# Patient Record
Sex: Female | Born: 1975
Health system: Southern US, Community
[De-identification: ages and names within clinical notes are randomized; demographics above are authoritative.]

## PROBLEM LIST (undated history)

## (undated) DIAGNOSIS — G8384 Todd's paralysis (postepileptic): Secondary | ICD-10-CM

## (undated) DIAGNOSIS — J45909 Unspecified asthma, uncomplicated: Secondary | ICD-10-CM

## (undated) DIAGNOSIS — G43109 Migraine with aura, not intractable, without status migrainosus: Secondary | ICD-10-CM

## (undated) DIAGNOSIS — K2270 Barrett's esophagus without dysplasia: Secondary | ICD-10-CM

## (undated) DIAGNOSIS — E119 Type 2 diabetes mellitus without complications: Secondary | ICD-10-CM

## (undated) DIAGNOSIS — R569 Unspecified convulsions: Secondary | ICD-10-CM

## (undated) DIAGNOSIS — J40 Bronchitis, not specified as acute or chronic: Secondary | ICD-10-CM

## (undated) DIAGNOSIS — I10 Essential (primary) hypertension: Secondary | ICD-10-CM

## (undated) DIAGNOSIS — G43909 Migraine, unspecified, not intractable, without status migrainosus: Secondary | ICD-10-CM

## (undated) DIAGNOSIS — K5090 Crohn's disease, unspecified, without complications: Secondary | ICD-10-CM

## (undated) DIAGNOSIS — G4730 Sleep apnea, unspecified: Secondary | ICD-10-CM

## (undated) DIAGNOSIS — K760 Fatty (change of) liver, not elsewhere classified: Secondary | ICD-10-CM

## (undated) HISTORY — PX: PARTIAL HYSTERECTOMY: SHX80

---

## 2005-12-21 ENCOUNTER — Emergency Department (HOSPITAL_COMMUNITY): Admission: EM | Admit: 2005-12-21 | Discharge: 2005-12-21 | Payer: Self-pay | Admitting: Emergency Medicine

## 2014-08-01 ENCOUNTER — Emergency Department (HOSPITAL_COMMUNITY): Payer: Medicaid Other

## 2014-08-01 ENCOUNTER — Observation Stay (HOSPITAL_COMMUNITY)
Admission: EM | Admit: 2014-08-01 | Discharge: 2014-08-02 | Disposition: A | Discharge status: Home / Self Care | Payer: Medicaid Other | Attending: Internal Medicine | Admitting: Internal Medicine

## 2014-08-01 ENCOUNTER — Encounter (HOSPITAL_COMMUNITY): Payer: Self-pay | Admitting: *Deleted

## 2014-08-01 DIAGNOSIS — Z87891 Personal history of nicotine dependence: Secondary | ICD-10-CM | POA: Diagnosis not present

## 2014-08-01 DIAGNOSIS — Z8719 Personal history of other diseases of the digestive system: Secondary | ICD-10-CM | POA: Insufficient documentation

## 2014-08-01 DIAGNOSIS — Z91040 Latex allergy status: Secondary | ICD-10-CM | POA: Diagnosis not present

## 2014-08-01 DIAGNOSIS — F419 Anxiety disorder, unspecified: Secondary | ICD-10-CM | POA: Diagnosis not present

## 2014-08-01 DIAGNOSIS — Z79899 Other long term (current) drug therapy: Secondary | ICD-10-CM | POA: Diagnosis not present

## 2014-08-01 DIAGNOSIS — J45909 Unspecified asthma, uncomplicated: Secondary | ICD-10-CM | POA: Diagnosis not present

## 2014-08-01 DIAGNOSIS — R4701 Aphasia: Secondary | ICD-10-CM | POA: Diagnosis present

## 2014-08-01 DIAGNOSIS — G459 Transient cerebral ischemic attack, unspecified: Secondary | ICD-10-CM | POA: Diagnosis present

## 2014-08-01 DIAGNOSIS — K5090 Crohn's disease, unspecified, without complications: Secondary | ICD-10-CM | POA: Diagnosis present

## 2014-08-01 DIAGNOSIS — R079 Chest pain, unspecified: Secondary | ICD-10-CM | POA: Diagnosis present

## 2014-08-01 DIAGNOSIS — G40909 Epilepsy, unspecified, not intractable, without status epilepticus: Secondary | ICD-10-CM | POA: Insufficient documentation

## 2014-08-01 DIAGNOSIS — Z7951 Long term (current) use of inhaled steroids: Secondary | ICD-10-CM | POA: Diagnosis not present

## 2014-08-01 DIAGNOSIS — Z7952 Long term (current) use of systemic steroids: Secondary | ICD-10-CM | POA: Diagnosis not present

## 2014-08-01 DIAGNOSIS — F801 Expressive language disorder: Secondary | ICD-10-CM | POA: Insufficient documentation

## 2014-08-01 DIAGNOSIS — I639 Cerebral infarction, unspecified: Secondary | ICD-10-CM

## 2014-08-01 DIAGNOSIS — R251 Tremor, unspecified: Secondary | ICD-10-CM | POA: Diagnosis not present

## 2014-08-01 DIAGNOSIS — R479 Unspecified speech disturbances: Secondary | ICD-10-CM

## 2014-08-01 DIAGNOSIS — Z87898 Personal history of other specified conditions: Secondary | ICD-10-CM

## 2014-08-01 HISTORY — DX: Unspecified asthma, uncomplicated: J45.909

## 2014-08-01 HISTORY — DX: Bronchitis, not specified as acute or chronic: J40

## 2014-08-01 HISTORY — DX: Crohn's disease, unspecified, without complications: K50.90

## 2014-08-01 HISTORY — DX: Unspecified convulsions: R56.9

## 2014-08-01 LAB — I-STAT CHEM 8, ED
BUN: 15 mg/dL (ref 6–20)
Calcium, Ion: 1.2 mmol/L (ref 1.12–1.23)
Chloride: 101 mmol/L (ref 101–111)
Creatinine, Ser: 1.1 mg/dL — ABNORMAL HIGH (ref 0.44–1.00)
Glucose, Bld: 90 mg/dL (ref 70–99)
HCT: 43 % (ref 36.0–46.0)
Hemoglobin: 14.6 g/dL (ref 12.0–15.0)
Potassium: 4.4 mmol/L (ref 3.5–5.1)
Sodium: 143 mmol/L (ref 135–145)
TCO2: 27 mmol/L (ref 0–100)

## 2014-08-01 LAB — CBC
HCT: 39.8 % (ref 36.0–46.0)
Hemoglobin: 13.2 g/dL (ref 12.0–15.0)
MCH: 29.9 pg (ref 26.0–34.0)
MCHC: 33.2 g/dL (ref 30.0–36.0)
Platelets: 251 10*3/uL (ref 150–400)
RBC: 4.42 MIL/uL (ref 3.87–5.11)
RDW: 13.5 % (ref 11.5–15.5)

## 2014-08-01 LAB — DIFFERENTIAL
Basophils Relative: 0 % (ref 0–1)
Eosinophils Absolute: 0 10*3/uL (ref 0.0–0.7)
Eosinophils Relative: 0 % (ref 0–5)
Lymphocytes Relative: 16 % (ref 12–46)
Lymphs Abs: 3.2 10*3/uL (ref 0.7–4.0)
Monocytes Absolute: 1.4 10*3/uL — ABNORMAL HIGH (ref 0.1–1.0)
Monocytes Relative: 7 % (ref 3–12)
Neutro Abs: 15 10*3/uL — ABNORMAL HIGH (ref 1.7–7.7)
Neutrophils Relative %: 77 % (ref 43–77)

## 2014-08-01 LAB — COMPREHENSIVE METABOLIC PANEL
AST: 21 U/L (ref 15–41)
Albumin: 4.1 g/dL (ref 3.5–5.0)
Alkaline Phosphatase: 66 U/L (ref 38–126)
Anion gap: 6 (ref 5–15)
BUN: 13 mg/dL (ref 6–20)
CO2: 28 mmol/L (ref 22–32)
Calcium: 9.5 mg/dL (ref 8.9–10.3)
Chloride: 104 mmol/L (ref 101–111)
Creatinine, Ser: 1.03 mg/dL — ABNORMAL HIGH (ref 0.44–1.00)
GFR calc Af Amer: >60 mL/min (ref >60)
Glucose, Bld: 92 mg/dL (ref 70–99)
Total Bilirubin: 0.2 mg/dL — ABNORMAL LOW (ref 0.3–1.2)
Total Protein: 6.9 g/dL (ref 6.5–8.1)

## 2014-08-01 LAB — I-STAT TROPONIN, ED: Troponin i, poc: 0 ng/mL (ref 0.00–0.08)

## 2014-08-01 LAB — ETHANOL: Alcohol, Ethyl (B): <5 mg/dL (ref <5)

## 2014-08-01 LAB — PROTIME-INR
INR: 1.01 (ref 0.00–1.49)
Prothrombin Time: 13.4 seconds (ref 11.6–15.2)

## 2014-08-01 LAB — APTT: aPTT: 28 seconds (ref 24–37)

## 2014-08-01 NOTE — Code Documentation (Signed)
Cheryl Little presented to [facility] via pvt vehicle for slurred speech & Lt side numbness.  Per her sister she was taken to [facility] last pm for CP and asthma exacerbation.  She was d/c with po prednisone.  Later that evening she was noted to have slurred speech but was attributed to exhaustion.  Her sister noticed this am that she had more slurring of her speech but it seemed to improve some.  She took her to dinner around 8pm.  Her sister stated the pt went to the bathroom while at the restaurant and was gone a long time.  Upon returning to the table she was found to have worsening speech & Lt side weakness & was brought to the [facility].  She has stuttering speech but able to carry a conversation.  NIH 2

## 2014-08-01 NOTE — ED Notes (Signed)
[facility] contacted to call Code Stroke 

## 2014-08-01 NOTE — ED Notes (Signed)
Patient arrived via POV  Family reports that they were out to eat and she went to the [facility] and it took longer than usual and she sent someone in to check on her.  Found her with "odd speech"  Left sided weakness.

## 2014-08-01 NOTE — Consult Note (Signed)
Referring Physician: Dr. Jodi Mourning    Chief Complaint: code stroke, left sided numbness, slurred speech  HPI:                                                                                                                                         Cheryl Little is an 39 y.o. female with a past medical history significant for Crohn's disease, asthma, and seizures, brought in by her sister via private vehicle for further evaluation of the above stated symptoms. Her sister is at the bedside and indicated that patient started having some speech impediment last night, more noticeable this morning but it seemed to improve some.She took her to dinner around 8pm.and patient went to the bathroom while at the restaurant and was gone a long time. Upon returning to the table she was noted to be unable to form words and had left side  weakness-numbness. Patient was seen at [facility] last night due to chest pain and asthma exacerbation, was treated with IV solumedrol and benadryl and discharged home with instructions to take prednisone. NIHSS 2. CT brain was personally reviewed and showed no acute abnormality. Presently, she has stuttering speech.  Date last known well: 07/31/14 Time last known well: unclear tPA Given: no, late presentation NIHSS: 2 MRS: 0  Past Medical History  Diagnosis Date  . Seizures   . Asthma   . Bronchitis   . Crohn's disease     Past Surgical History  Procedure Laterality Date  . Partial hysterectomy      No family history on file. Social History:  reports that she has quit smoking. She has never used smokeless tobacco. She reports that she does not drink alcohol or use illicit drugs. Family history: no brain tumor, epilepsy, or brain aneurysms Allergies:  Allergies  Allergen Reactions  . Aspirin Anaphylaxis  . Latex Other (See Comments)    Inward and outward burning    Medications:                                                                                                                            I have reviewed the patient's current medications.  ROS:  History obtained from the patient, sister, and chart review  General ROS: negative for - chills, fatigue, fever, night sweats,  or weight loss Psychological ROS: negative for - behavioral disorder, hallucinations, memory difficulties, mood swings or suicidal ideation Ophthalmic ROS: negative for - blurry vision, double vision, eye pain or loss of vision ENT ROS: negative for - epistaxis, nasal discharge, oral lesions, sore throat, tinnitus or vertigo Allergy and Immunology ROS: negative for - hives or itchy/watery eyes Hematological and Lymphatic ROS: negative for - bleeding problems, bruising or swollen lymph nodes Endocrine ROS: negative for - galactorrhea, hair pattern changes, polydipsia/polyuria or temperature intolerance Respiratory ROS: negative for - cough, hemoptysis, shortness of breath or wheezing Cardiovascular ROS: significant for - chest pain Gastrointestinal ROS: negative for - abdominal pain, diarrhea, hematemesis, nausea/vomiting or stool incontinence Genito-Urinary ROS: negative for - dysuria, hematuria, incontinence or urinary frequency/urgency Musculoskeletal ROS: negative for - joint swelling or muscular weakness Neurological ROS: as noted in HPI Dermatological ROS: negative for rash and skin lesion changes    Physical exam: pleasant female in no apparent distress. Blood pressure 140/85, pulse 63, temperature 97.8 F (36.6 C), temperature source Oral, resp. rate 21, height 5\' 5"  (1.651 m), weight 94.802 kg (209 lb), SpO2 99 %. Neck: supple, no bruits, no JVD. Cardiac: no murmurs. Lungs: clear. Abdomen: soft, no tender, no mass. Extremities: no edema. Skin: no rash  Neurologic Examination:                                                                                                       General: Mental Status: Alert, oriented, thought content appropriate.  Stuttering peech but no frank dysarthria or evidence of aphasia.  Able to follow 3 step commands without difficulty. Cranial Nerves: II: Discs flat bilaterally; Visual fields grossly normal, pupils equal, round, reactive to light and accommodation III,IV, VI: ptosis not present, extra-ocular motions intact bilaterally V,VII: smile symmetric, facial light touch sensation normal bilaterally VIII: hearing normal bilaterally IX,X: uvula rises symmetrically XI: bilateral shoulder shrug XII: midline tongue extension without atrophy or fasciculations  Motor: Right : Upper extremity   5/5    Left:     Upper extremity   5/5  Lower extremity   5/5     Lower extremity   5/5 Tone and bulk:normal tone throughout; no atrophy noted Sensory: Pinprick and light touch diminished in the left side. Deep Tendon Reflexes:  Right: Upper Extremity   Left: Upper extremity   biceps (C-5 to C-6) 2/4   biceps (C-5 to C-6) 2/4 tricep (C7) 2/4    triceps (C7) 2/4 Brachioradialis (C6) 2/4  Brachioradialis (C6) 2/4  Lower Extremity Lower Extremity  quadriceps (L-2 to L-4) 2/4   quadriceps (L-2 to L-4) 2/4 Achilles (S1) 2/4   Achilles (S1) 2/4  Plantars: Right: downgoing   Left: downgoing Cerebellar: normal finger-to-nose,  normal heel-to-shin test Gait:  Unable to test due to multiple leads    Results for orders placed or performed during the hospital encounter of 08/01/14 (from the past 48 hour(s))  Protime-INR  Status: None   Collection Time: 08/01/14 10:10 PM  Result Value Ref Range   Prothrombin Time 13.4 11.6 - 15.2 seconds   INR 1.01 0.00 - 1.49  APTT     Status: None   Collection Time: 08/01/14 10:10 PM  Result Value Ref Range   aPTT 28 24 - 37 seconds  CBC     Status: Abnormal   Collection Time: 08/01/14 10:10 PM  Result Value Ref Range   WBC 19.5 (H) 4.0 - 10.5 K/uL   RBC  4.42 3.87 - 5.11 MIL/uL   Hemoglobin 13.2 12.0 - 15.0 g/dL   HCT 40.9 81.1 - 91.4 %   MCV 90.0 78.0 - 100.0 fL   MCH 29.9 26.0 - 34.0 pg   MCHC 33.2 30.0 - 36.0 g/dL   RDW 78.2 95.6 - 21.3 %   Platelets 251 150 - 400 K/uL  Differential     Status: Abnormal   Collection Time: 08/01/14 10:10 PM  Result Value Ref Range   Neutrophils Relative % 77 43 - 77 %   Neutro Abs 15.0 (H) 1.7 - 7.7 K/uL   Lymphocytes Relative 16 12 - 46 %   Lymphs Abs 3.2 0.7 - 4.0 K/uL   Monocytes Relative 7 3 - 12 %   Monocytes Absolute 1.4 (H) 0.1 - 1.0 K/uL   Eosinophils Relative 0 0 - 5 %   Eosinophils Absolute 0.0 0.0 - 0.7 K/uL   Basophils Relative 0 0 - 1 %   Basophils Absolute 0.0 0.0 - 0.1 K/uL  I-Stat Chem 8, ED  (not at [facility], [facility])     Status: Abnormal   Collection Time: 08/01/14 10:21 PM  Result Value Ref Range   Sodium 143 135 - 145 mmol/L   Potassium 4.4 3.5 - 5.1 mmol/L   Chloride 101 101 - 111 mmol/L   BUN 15 6 - 20 mg/dL   Creatinine, Ser 0.86 (H) 0.44 - 1.00 mg/dL   Glucose, Bld 90 70 - 99 mg/dL   Calcium, Ion 5.78 1.12 - 1.23 mmol/L   TCO2 27 0 - 100 mmol/L   Hemoglobin 14.6 12.0 - 15.0 g/dL   HCT 46.9 62.9 - 52.8 %  I-stat troponin, ED (not at [facility], [facility])     Status: None   Collection Time: 08/01/14 10:21 PM  Result Value Ref Range   Troponin i, poc 0.00 0.00 - 0.08 ng/mL   Comment 3            Comment: Due to the release kinetics of cTnI, a negative result within the first hours of the onset of symptoms does not rule out myocardial infarction with certainty. If myocardial infarction is still suspected, repeat the test at appropriate intervals.    Ct Head Wo Contrast  08/01/2014   CLINICAL DATA:  Code stroke. Slurred speech and left-sided weakness.  EXAM: CT HEAD WITHOUT CONTRAST  TECHNIQUE: Contiguous axial images were obtained from the base of the skull through the vertex without intravenous contrast.  COMPARISON:  None.  FINDINGS: The ventricles and sulci are within normal  limits for age. There is no evidence of acute infarct, intracranial hemorrhage, mass, midline shift, or extra-axial collection.  The orbits are unremarkable. The visualized paranasal sinuses and mastoid air cells are clear. No skull fracture is identified.  IMPRESSION: Unremarkable head CT.  These results were called by telephone at the time of interpretation on 08/01/2014 at 10:28 pm to Dr. Leroy Kennedy, who verbally acknowledged these results.   Electronically Signed  By: Sebastian Ache   On: 08/01/2014 22:28    Assessment: 39 y.o. female with acute onset stuttering speech and left sided numbness. NIHSS 2. CT brain negative for acute abnormality. Functional stuttering, presentation less likely due to medication side effects. MRI brain. If negative, no further neuro testing required. However, will recommend observing patient overnight she this is her second visit to an ED in 24 hours.   Wyatt Portela, MD [facility] [phone]  08/01/2014, 10:48 PM

## 2014-08-02 DIAGNOSIS — K5090 Crohn's disease, unspecified, without complications: Secondary | ICD-10-CM

## 2014-08-02 DIAGNOSIS — Z87898 Personal history of other specified conditions: Secondary | ICD-10-CM

## 2014-08-02 DIAGNOSIS — Z8669 Personal history of other diseases of the nervous system and sense organs: Secondary | ICD-10-CM

## 2014-08-02 DIAGNOSIS — R4701 Aphasia: Secondary | ICD-10-CM | POA: Diagnosis not present

## 2014-08-02 DIAGNOSIS — Z789 Other specified health status: Secondary | ICD-10-CM

## 2014-08-02 DIAGNOSIS — G459 Transient cerebral ischemic attack, unspecified: Secondary | ICD-10-CM

## 2014-08-02 DIAGNOSIS — R079 Chest pain, unspecified: Secondary | ICD-10-CM | POA: Diagnosis not present

## 2014-08-02 DIAGNOSIS — J45909 Unspecified asthma, uncomplicated: Secondary | ICD-10-CM | POA: Diagnosis not present

## 2014-08-02 LAB — TROPONIN I
Troponin I: <0.03 ng/mL (ref <0.031)
Troponin I: <0.03 ng/mL (ref <0.031)

## 2014-08-02 LAB — RAPID URINE DRUG SCREEN, HOSP PERFORMED
Amphetamines: NOT DETECTED
Barbiturates: NOT DETECTED
Benzodiazepines: NOT DETECTED
Cocaine: NOT DETECTED
Opiates: NOT DETECTED
Tetrahydrocannabinol: NOT DETECTED

## 2014-08-02 LAB — URINALYSIS, ROUTINE W REFLEX MICROSCOPIC
Hgb urine dipstick: NEGATIVE
Leukocytes, UA: NEGATIVE
Nitrite: NEGATIVE
Protein, ur: NEGATIVE mg/dL
Specific Gravity, Urine: 1.024 (ref 1.005–1.030)
Urobilinogen, UA: 0.2 mg/dL (ref 0.0–1.0)
pH: 6.5 (ref 5.0–8.0)

## 2014-08-02 LAB — LIPID PANEL
Cholesterol: 189 mg/dL (ref 0–200)
HDL: 45 mg/dL (ref >40)
LDL Cholesterol: 116 mg/dL — ABNORMAL HIGH (ref 0–99)
Total CHOL/HDL Ratio: 4.2 RATIO
Triglycerides: 140 mg/dL (ref <150)
VLDL: 28 mg/dL (ref 0–40)

## 2014-08-02 MED ORDER — STROKE: EARLY STAGES OF RECOVERY BOOK
Freq: Once | Status: AC
  Administered 2014-08-02: 1
  Filled 2014-08-02: qty 1

## 2014-08-02 MED ORDER — ENOXAPARIN SODIUM 40 MG/0.4ML ~~LOC~~ SOLN
40.0000 mg | SUBCUTANEOUS | Status: DC
  Administered 2014-08-02: 40 mg via SUBCUTANEOUS
  Filled 2014-08-02: qty 0.4

## 2014-08-02 MED ORDER — IPRATROPIUM-ALBUTEROL 0.5-2.5 (3) MG/3ML IN SOLN
3.0000 mL | Freq: Four times a day (QID) | RESPIRATORY_TRACT | Status: DC | PRN
  Administered 2014-08-02: 3 mL via RESPIRATORY_TRACT
  Filled 2014-08-02: qty 3

## 2014-08-02 NOTE — ED Notes (Signed)
Dr. Jenkins at bedside. 

## 2014-08-02 NOTE — ED Notes (Signed)
3W APPT 0130

## 2014-08-02 NOTE — Discharge Summary (Signed)
PATIENT DETAILS Name: Cheryl Little Age: 39 y.o. Sex: female Date of Birth: Mar 18, 1976 MRN: [id number]. Admitting Physician: Cheryl Parker, MD PCP:No primary care provider on file.  Admit Date: 08/01/2014 Discharge date: 08/02/2014  Recommendations for Outpatient Follow-up:  1. Gen Health Maintenance  PRIMARY DISCHARGE DIAGNOSIS:  Active Problems:  Functional Stuterring   Chest pain   Asthma   Crohn's disease   History of seizure      PAST MEDICAL HISTORY: Past Medical History  Diagnosis Date  . Seizures   . Asthma   . Bronchitis   . Crohn's disease     DISCHARGE MEDICATIONS: Current Discharge Medication List    STOP taking these medications     acetaminophen (TYLENOL) 500 MG tablet      albuterol (PROVENTIL HFA;VENTOLIN HFA) 108 (90 BASE) MCG/ACT inhaler      ipratropium (ATROVENT) 0.02 % nebulizer solution      mometasone-formoterol (DULERA) 200-5 MCG/ACT AERO      predniSONE (DELTASONE) 20 MG tablet         ALLERGIES:   Allergies  Allergen Reactions  . Aspirin Anaphylaxis  . Latex Other (See Comments)    Inward and outward burning    BRIEF HPI:  See H&P, Labs, Consult and Test reports for all details in brief, patient was admitted for speech difficulty and right arm weakness.  CONSULTATIONS:   neurology  PERTINENT RADIOLOGIC STUDIES: Ct Head Wo Contrast  08/01/2014   CLINICAL DATA:  Code stroke. Slurred speech and left-sided weakness.  EXAM: CT HEAD WITHOUT CONTRAST  TECHNIQUE: Contiguous axial images were obtained from the base of the skull through the vertex without intravenous contrast.  COMPARISON:  None.  FINDINGS: The ventricles and sulci are within normal limits for age. There is no evidence of acute infarct, intracranial hemorrhage, mass, midline shift, or extra-axial collection.  The orbits are unremarkable. The visualized paranasal sinuses and mastoid air cells are clear. No skull fracture is identified.  IMPRESSION: Unremarkable  head CT.  These results were called by telephone at the time of interpretation on 08/01/2014 at 10:28 pm to Cheryl. Leroy Little, who verbally acknowledged these results.   Electronically Signed   By: Cheryl Little   On: 08/01/2014 22:28   Mr Brain Wo Contrast  08/01/2014   CLINICAL DATA:  39 year old female with acute onset stuttering speech and left-sided numbness.  EXAM: MRI HEAD WITHOUT CONTRAST  TECHNIQUE: Multiplanar, multiecho pulse sequences of the brain and surrounding structures were obtained without intravenous contrast.  COMPARISON:  Prior CT from earlier the same day.  FINDINGS: The CSF containing spaces are within normal limits for patient age. No focal parenchymal signal abnormality is identified. No mass lesion, midline shift, or extra-axial fluid collection. Ventricles are normal in size without evidence of hydrocephalus.  No diffusion-weighted signal abnormality is identified to suggest acute intracranial infarct. Gray-white matter differentiation is maintained. Normal flow voids are seen within the intracranial vasculature. No intracranial hemorrhage identified.  The cervicomedullary junction is normal. Pituitary gland is within normal limits. Pituitary stalk is midline. The globes and optic nerves demonstrate a normal appearance with normal signal intensity.  The bone marrow signal intensity is normal. Calvarium is intact. Visualized upper cervical spine is within normal limits.  Scalp soft tissues are unremarkable.  Paranasal sinuses are clear.  No mastoid effusion.  IMPRESSION: Normal MRI of the brain.   Electronically Signed   By: Rise Mu M.D.   On: 08/01/2014 23:59     PERTINENT LAB RESULTS: CBC:  Recent Labs  08/01/14 2210 08/01/14 2221  WBC 19.5*  --   HGB 13.2 14.6  HCT 39.8 43.0  PLT 251  --    CMET CMP     Component Value Date/Time   NA 143 08/01/2014 2221   K 4.4 08/01/2014 2221   CL 101 08/01/2014 2221   CO2 28 08/01/2014 2210   GLUCOSE 90 08/01/2014 2221    BUN 15 08/01/2014 2221   CREATININE 1.10* 08/01/2014 2221   CALCIUM 9.5 08/01/2014 2210   PROT 6.9 08/01/2014 2210   ALBUMIN 4.1 08/01/2014 2210   AST 21 08/01/2014 2210   ALT 19 08/01/2014 2210   ALKPHOS 66 08/01/2014 2210   BILITOT 0.2* 08/01/2014 2210   GFRNONAA >60 08/01/2014 2210   GFRAA >60 08/01/2014 2210    GFR Estimated Creatinine Clearance: 80.4 mL/min (by C-G formula based on Cr of 1.1). No results for input(s): LIPASE, AMYLASE in the last 72 hours.  Recent Labs  08/02/14 0336 08/02/14 0850  TROPONINI <0.03 <0.03   Invalid input(s): POCBNP No results for input(s): DDIMER in the last 72 hours. No results for input(s): HGBA1C in the last 72 hours.  Recent Labs  08/02/14 0645  CHOL 189  HDL 45  LDLCALC 116*  TRIG 140  CHOLHDL 4.2   No results for input(s): TSH, T4TOTAL, T3FREE, THYROIDAB in the last 72 hours.  Invalid input(s): FREET3 No results for input(s): VITAMINB12, FOLATE, FERRITIN, TIBC, IRON, RETICCTPCT in the last 72 hours. Coags:  Recent Labs  08/01/14 2210  INR 1.01   Microbiology: No results found for this or any previous visit (from the past 240 hour(s)).   BRIEF HOSPITAL COURSE:  Stuttering of speech/left sided weakness: inconsistent exam--seen by this MD on day of discharge-saw her lift her left arm, also observed to pull her self up to sit in bed using her left upper ext-MRI Brain negative for CVA. Reviewed Neuro consult-recommended no further work up if MRI brain negative. Subsequently spoke with Neuro (Cheryl Little has already signed off-no further recommendations. Current suspicion for a functional disorder. Have provided reassurance to patient and mother. Stable for discharge home.  Aphasia  Asthma:lungs clear-no wheezing-doubt asthma flare   History of seizure:claims seizure free for >1 year-and that her PCP weaned her off Depakote. Her current neuro issues are more functional that Cheryl Little's paresis   TODAY-DAY OF  DISCHARGE:  Subjective:   Cheryl Little today has no headache,no chest abdominal pain,no new weakness tingling or numbness, feels much better wants to go home today.   Objective:   Blood pressure 129/85, pulse 63, temperature 98.4 F (36.9 C), temperature source Oral, resp. rate 18, height  (1.651 m), weight 96.206 kg (212 lb 1.5 oz), SpO2 100 %.  Intake/Output Summary (Last 24 hours) at 08/02/14 1054 Last data filed at 08/02/14 0824  Gross per 24 hour  Intake      0 ml  Output    350 ml  Net   -350 ml   Filed Weights   08/01/14 2224 08/02/14 0200 08/02/14 0600  Weight: 94.802 kg (209 lb) 96.026 kg (211 lb 11.2 oz) 96.206 kg (212 lb 1.5 oz)    Exam Awake Alert, Oriented *3, No new F.N deficits, Normal affect [place].AT,PERRAL Supple Neck,No JVD, No cervical lymphadenopathy appriciated.  Symmetrical Chest wall movement, Good air movement bilaterally, CTAB RRR,No Gallops,Rubs or new Murmurs, No Parasternal Heave +ve B.Sounds, Abd Soft, Non tender, No organomegaly appriciated, No rebound -guarding or rigidity. No Cyanosis, Clubbing or  edema, No new Rash or bruise  DISCHARGE CONDITION: Stable  DISPOSITION: Home  DISCHARGE INSTRUCTIONS:    Activity:  As tolerated with Full fall precautions use walker/cane & assistance as needed  Diet recommendation: Regular Diet  Discharge Instructions    Diet general    Complete by:  As directed      Increase activity slowly    Complete by:  As directed            Follow-up Information    Schedule an appointment as soon as possible for a visit in 1 week to follow up.   Why:  for hospital discharge   Contact information:   Aaron Edelman      Total Time spent on discharge equals 25 minutes.  SignedJeoffrey Massed 08/02/2014 10:54 AM

## 2014-08-02 NOTE — ED Provider Notes (Signed)
CSN: [id number]     Arrival date & time 08/01/14  2201 History   First MD Initiated Contact with Patient 08/01/14 2219     Chief Complaint  Patient presents with  . Code Stroke    @ (Consider location/radiation/quality/duration/timing/severity/associated sxs/prior Treatment) HPI Comments: 39 year old female past smoker, asthma and seizure history, not on seizure medications his been doing well for the past year presented with expressive aphasia since last night. Around 8:00 this evening patient had left-sided numbness. Patient was recently seen at [facility] and given steroids. No history of similar. No significant headache fevers or chills. No history of stroke.  Nothing has improved her speech.  The history is provided by a relative.    Past Medical History  Diagnosis Date  . Seizures   . Asthma   . Bronchitis   . Crohn's disease    Past Surgical History  Procedure Laterality Date  . Partial hysterectomy     No family history on file. History  Substance Use Topics  . Smoking status: Former Games developer  . Smokeless tobacco: Never Used  . Alcohol Use: No   OB History    No data available     Review of Systems  Constitutional: Negative for fever and chills.  HENT: Negative for congestion.   Eyes: Negative for visual disturbance.  Respiratory: Negative for shortness of breath.   Cardiovascular: Negative for chest pain.  Gastrointestinal: Negative for vomiting and abdominal pain.  Genitourinary: Negative for dysuria and flank pain.  Musculoskeletal: Negative for back pain, neck pain and neck stiffness.  Skin: Negative for rash.  Neurological: Positive for speech difficulty and numbness. Negative for light-headedness and headaches.  Psychiatric/Behavioral: The patient is nervous/anxious.       Allergies  Aspirin and Latex  Home Medications   Prior to Admission medications   Medication Sig Start Date End Date Taking? Authorizing Provider  acetaminophen (TYLENOL)  500 MG tablet Take 500 mg by mouth every 6 (six) hours as needed (pain).   Yes Historical Provider, MD  albuterol (PROVENTIL HFA;VENTOLIN HFA) 108 (90 BASE) MCG/ACT inhaler Inhale 1 puff into the lungs every 6 (six) hours as needed for wheezing or shortness of breath.   Yes Historical Provider, MD  ipratropium (ATROVENT) 0.02 % nebulizer solution Take 0.5 mg by nebulization every 6 (six) hours as needed for wheezing or shortness of breath.   Yes Historical Provider, MD  mometasone-formoterol (DULERA) 200-5 MCG/ACT AERO Inhale 2 puffs into the lungs 2 (two) times daily.   Yes Historical Provider, MD  predniSONE (DELTASONE) 20 MG tablet Take 20 mg by mouth 2 (two) times daily with a meal. 4 day course started 08/01/14   Yes Historical Provider, MD   BP 131/72 mmHg  Pulse 67  Temp(Src) 97.8 F (36.6 C) (Oral)  Resp 17  Ht  (1.651 m)  Wt 209 lb (94.802 kg)  BMI 34.78 kg/m2  SpO2 98% Physical Exam  Constitutional: She is oriented to person, place, and time. She appears well-developed and well-nourished.  HENT:  Head: Normocephalic and atraumatic.  Eyes: Conjunctivae are normal. Right eye exhibits no discharge. Left eye exhibits no discharge.  Neck: Normal range of motion. Neck supple. No tracheal deviation present.  Cardiovascular: Normal rate and regular rhythm.   Pulmonary/Chest: Effort normal and breath sounds normal.  Abdominal: Soft. She exhibits no distension. There is no tenderness. There is no guarding.  Musculoskeletal: She exhibits no edema.  Neurological: She is alert and oriented to person, place, and time.  5+ strength in UE and LE with f/e at major joints. Sensation to palpation intact in UE and LE. CNs 2-12 grossly intact.  EOMFI.  PERRL.   Finger nose and coordination intact bilateral.   Visual fields intact to finger testing. Patient has mild intermittent expressive aphasia at times normal speech.  Skin: Skin is warm. No rash noted.  Psychiatric: She has a normal mood  and affect.  Nursing note and vitals reviewed.   ED Course  Procedures (including critical care time) Labs Review Labs Reviewed  COMPREHENSIVE METABOLIC PANEL - Abnormal; Notable for the following:    Creatinine, Ser 1.03 (*)    Total Bilirubin 0.2 (*)    All other components within normal limits  CBC - Abnormal; Notable for the following:    WBC 19.5 (*)    All other components within normal limits  DIFFERENTIAL - Abnormal; Notable for the following:    Neutro Abs 15.0 (*)    Monocytes Absolute 1.4 (*)    All other components within normal limits  I-STAT CHEM 8, ED - Abnormal; Notable for the following:    Creatinine, Ser 1.10 (*)    All other components within normal limits  ETHANOL  PROTIME-INR  APTT  URINE RAPID DRUG SCREEN (HOSP PERFORMED)  URINALYSIS, ROUTINE W REFLEX MICROSCOPIC  I-STAT TROPOININ, ED    Imaging Review Ct Head Wo Contrast  08/01/2014   CLINICAL DATA:  Code stroke. Slurred speech and left-sided weakness.  EXAM: CT HEAD WITHOUT CONTRAST  TECHNIQUE: Contiguous axial images were obtained from the base of the skull through the vertex without intravenous contrast.  COMPARISON:  None.  FINDINGS: The ventricles and sulci are within normal limits for age. There is no evidence of acute infarct, intracranial hemorrhage, mass, midline shift, or extra-axial collection.  The orbits are unremarkable. The visualized paranasal sinuses and mastoid air cells are clear. No skull fracture is identified.  IMPRESSION: Unremarkable head CT.  These results were called by telephone at the time of interpretation on 08/01/2014 at 10:28 pm to Dr. Leroy Kennedy, who verbally acknowledged these results.   Electronically Signed   By: Sebastian Ache   On: 08/01/2014 22:28   Mr Brain Wo Contrast  08/01/2014   CLINICAL DATA:  39 year old female with acute onset stuttering speech and left-sided numbness.  EXAM: MRI HEAD WITHOUT CONTRAST  TECHNIQUE: Multiplanar, multiecho pulse sequences of the brain and  surrounding structures were obtained without intravenous contrast.  COMPARISON:  Prior CT from earlier the same day.  FINDINGS: The CSF containing spaces are within normal limits for patient age. No focal parenchymal signal abnormality is identified. No mass lesion, midline shift, or extra-axial fluid collection. Ventricles are normal in size without evidence of hydrocephalus.  No diffusion-weighted signal abnormality is identified to suggest acute intracranial infarct. Gray-white matter differentiation is maintained. Normal flow voids are seen within the intracranial vasculature. No intracranial hemorrhage identified.  The cervicomedullary junction is normal. Pituitary gland is within normal limits. Pituitary stalk is midline. The globes and optic nerves demonstrate a normal appearance with normal signal intensity.  The bone marrow signal intensity is normal. Calvarium is intact. Visualized upper cervical spine is within normal limits.  Scalp soft tissues are unremarkable.  Paranasal sinuses are clear.  No mastoid effusion.  IMPRESSION: Normal MRI of the brain.   Electronically Signed   By: Rise Mu M.D.   On: 08/01/2014 23:59     EKG Interpretation   Date/Time:  Saturday Aug 01 2014 22:26:48 EDT Ventricular  Rate:  64 PR Interval:  120 QRS Duration: 76 QT Interval:  399 QTC Calculation: 412 R Axis:   54 Text Interpretation:  Sinus rhythm Confirmed by Le Ferraz  MD, Kirkland Figg ([id number])  on 08/02/2014 12:33:04 AM      MDM   Final diagnoses:  Occasional tremors  Expressive aphasia   Patient presents with expressive aphasia, numbness in the left side of her body started around 8 PM, code stroke called by nursing. Neurology assisted with testing at the bedside, no acute deficits. Concern for possible functional, less likely seizure related and less likely stroke. MRI ordered of the brain no acute findings. Neurology recommends observation in the hospital.  Spoke with neurology, rec  observation.   The patients results and plan were reviewed and discussed.   Any x-rays performed were personally reviewed by myself.   Differential diagnosis were considered with the presenting HPI.  Medications - No data to display  Filed Vitals:   08/01/14 2342 08/01/14 2345 08/02/14 0000 08/02/14 0024  BP: 125/84 130/75 131/87 131/72  Pulse: 67 67 64 67  Temp:      TempSrc:      Resp: 19 15 14 17   Height:      Weight:      SpO2: 98% 99% 99% 98%    Final diagnoses:  Occasional tremors  Expressive aphasia    Admission/ observation were discussed with the admitting physician, patient and/or family and they are comfortable with the plan.     Blane Ohara, MD 08/02/14 [phone]

## 2014-08-02 NOTE — Progress Notes (Signed)
UR completed 

## 2014-08-02 NOTE — H&P (Addendum)
[facility] Admission History and Physical       Cheryl Little ZOX:[id number] DOB: 04-21-1975 DOA: 08/01/2014  Referring physician: EDP PCP: No primary care provider on file.  Specialists:   Chief Complaint: Left Sided Weakness,  Difficulty Speaking,  and Chest Pain  HPI: Cheryl Little is a 39 y.o. female with a history of Asthma, Crohn's Disease, and Past hx of Seizures ( No Seizures > 2 years and off Medications x 8 Months) who presents to the ED with complaints of Left sided weakness since 8 pm.    Prior to this she had intermittent episodes of difficulty with her speech, describing it as her speech was clear but would taper off into slurring and this began 1 day ago.   She reports having a posterior neck area  Headache, as well as intermittent chest pain -chest tightness for the past 2 days and was seen at the [facility] ED last night and administered IV Solumedrol and prescribed Prednisone for an Asthma Exacerbation.  She reports her headache and chest tightness only  improved when she was placed on the facial mask Oxygen administered in the ED.    She was evaluated as a Code Stroke in the ED, and was seen by Dr. Cyril Little Neurology.   Her initial Head Ct Scan was negative, and the MRI results were also negative for acute findings.      Review of Systems:    Constitutional: No Weight Loss, No Weight Gain, Night Sweats, Fevers, Chills, Dizziness, Light Headedness, Fatigue, or Generalized Weakness HEENT: No +Headaches, Difficulty Swallowing,Tooth/Dental Problems,Sore Throat,  No Sneezing, Rhinitis, Ear Ache, Nasal Congestion, or Post Nasal Drip,  Cardio-vascular:  +Chest pain, Orthopnea, PND, Edema in Lower Extremities, Anasarca, Dizziness, Palpitations  Resp: No Dyspnea, No DOE, No Productive Cough, No Non-Productive Cough, No Hemoptysis, No Wheezing.    GI: No Heartburn, Indigestion, Abdominal Pain, Nausea, Vomiting, Diarrhea, Constipation, Hematemesis, Hematochezia, Melena, Change in  Bowel Habits,  Loss of Appetite  GU: No Dysuria, No Change in Color of Urine, No Urgency or Urinary Frequency, No Flank pain.  Musculoskeletal: No Joint Pain or Swelling, No Decreased Range of Motion, No Back Pain.  Neurologic: No Syncope, No Seizures, +Left Sided Weakness, Paresthesia, Vision Disturbance, No Diplopia,+Speech Difficulties, No Vertigo, No Difficulty Walking,  Skin: No Rash or Lesions. Psych: No Change in Mood or Affect, No Depression or Anxiety, No Memory loss, No Confusion, or Hallucinations   Past Medical History  Diagnosis Date  . Seizures   . Asthma   . Bronchitis   . Crohn's disease      Past Surgical History  Procedure Laterality Date  . Partial hysterectomy        Prior to Admission medications   Medication Sig Start Date End Date Taking? Authorizing Provider  acetaminophen (TYLENOL) 500 MG tablet Take 500 mg by mouth every 6 (six) hours as needed (pain).   Yes Historical Provider, MD  albuterol (PROVENTIL HFA;VENTOLIN HFA) 108 (90 BASE) MCG/ACT inhaler Inhale 1 puff into the lungs every 6 (six) hours as needed for wheezing or shortness of breath.   Yes Historical Provider, MD  ipratropium (ATROVENT) 0.02 % nebulizer solution Take 0.5 mg by nebulization every 6 (six) hours as needed for wheezing or shortness of breath.   Yes Historical Provider, MD  mometasone-formoterol (DULERA) 200-5 MCG/ACT AERO Inhale 2 puffs into the lungs 2 (two) times daily.   Yes Historical Provider, MD  predniSONE (DELTASONE) 20 MG tablet Take 20 mg by mouth 2 (two) times  daily with a meal. 4 day course started 08/01/14   Yes Historical Provider, MD     Allergies  Allergen Reactions  . Aspirin Anaphylaxis  . Latex Other (See Comments)    Inward and outward burning    Social History:  reports that she has quit smoking. She has never used smokeless tobacco. She reports that she does not drink alcohol or use illicit drugs.      Family History:      Mother has HTN   Maternal  Grandmother -CVA, and ASCVD   Paternal Grandmother - HTN, and Thyroid Disease    Physical Exam:  GEN:  Pleasant Obese 39 y.o. African American female examined and in no acute distress; cooperative with exam Filed Vitals:   08/02/14 0024 08/02/14 0030 08/02/14 0043 08/02/14 0100  BP: 131/72 126/77  136/77  Pulse: 67 71  79  Temp:   98.1 F (36.7 C)   TempSrc:      Resp: 17 14  17   Height:      Weight:      SpO2: 98% 97%  99%   Blood pressure 136/77, pulse 79, temperature 98.1 F (36.7 C), temperature source Oral, resp. rate 17, height 5\' 5"  (1.651 m), weight 94.802 kg (209 lb), SpO2 99 %. PSYCH: She is alert and oriented x4; does not appear anxious does not appear depressed; affect is normal HEENT: Normocephalic and Atraumatic, Mucous membranes pink; PERRLA; EOM intact; Fundi:  Benign;  No scleral icterus, Nares: Patent, Oropharynx: Clear,  Fair Dentition,    Neck:  FROM, No Cervical Lymphadenopathy nor Thyromegaly or Carotid Bruit; No JVD; Breasts:: Not examined CHEST WALL: No tenderness CHEST: Normal respiration, clear to auscultation bilaterally HEART: Regular rate and rhythm; no murmurs rubs or gallops BACK: No kyphosis or scoliosis; No CVA tenderness ABDOMEN: Positive Bowel Sounds, Obese, Soft Non-Tender, No Rebound or Guarding; No Masses, No Organomegaly. Rectal Exam: Not done EXTREMITIES: No Cyanosis, Clubbing, or Edema; No Ulcerations. Genitalia: not examined PULSES: 2+ and symmetric SKIN: Normal hydration no rash or ulceration  CNS:  Alert and Oriented x 4, No Focal Deficits  Mental Status:  Alert, Oriented, Thought Content Appropriate. Speech Fluent without evidence of Aphasia.     Able to follow 3 step commands without difficulty.  In No obvious pain.    Cranial Nerves:  II: Discs flat bilaterally; Visual fields Intact, Pupils equal and reactive.     III, IV, VI: Extra-ocular motions intact bilaterally     V,VII: smile symmetric, facial light touch sensation  normal bilaterally     VIII: hearing intact     IX,X: gag reflex present     XI: bilateral shoulder shrug     XII: midline tongue extension    Motor:  Right:  Upper extremity 5/5     Left:  Upper extremity 5-/5      Right:  Lower extremity 5/5    Left:  Lower extremity 5-/5    Tone and Bulk:  normal tone throughout; no atrophy noted    Sensory:  Pinprick and light touch intact throughout, bilaterally    Deep Tendon Reflexes: 2+ and symmetric throughout    Plantars/ Babinski:  Right:  normal  Left:  normal     Cerebellar:  Finger to nose without difficulty.    Gait: deferred    Vascular: pulses palpable throughout    Labs on Admission:  Basic Metabolic Panel:  Recent Labs Lab 08/01/14 2210 08/01/14 2221  NA 138 143  K 4.3  4.4  CL 104 101  CO2 28  --   GLUCOSE 92 90  BUN 13 15  CREATININE 1.03* 1.10*  CALCIUM 9.5  --    Liver Function Tests:  Recent Labs Lab 08/01/14 2210  AST 21  ALT 19  ALKPHOS 66  BILITOT 0.2*  PROT 6.9  ALBUMIN 4.1   No results for input(s): LIPASE, AMYLASE in the last 168 hours. No results for input(s): AMMONIA in the last 168 hours. CBC:  Recent Labs Lab 08/01/14 2210 08/01/14 2221  WBC 19.5*  --   NEUTROABS 15.0*  --   HGB 13.2 14.6  HCT 39.8 43.0  MCV 90.0  --   PLT 251  --    Cardiac Enzymes: No results for input(s): CKTOTAL, CKMB, CKMBINDEX, TROPONINI in the last 168 hours.  BNP (last 3 results) No results for input(s): BNP in the last 8760 hours.  ProBNP (last 3 results) No results for input(s): PROBNP in the last 8760 hours.  CBG: No results for input(s): GLUCAP in the last 168 hours.  Radiological Exams on Admission: Ct Head Wo Contrast  08/01/2014   CLINICAL DATA:  Code stroke. Slurred speech and left-sided weakness.  EXAM: CT HEAD WITHOUT CONTRAST  TECHNIQUE: Contiguous axial images were obtained from the base of the skull through the vertex without intravenous contrast.  COMPARISON:  None.  FINDINGS: The  ventricles and sulci are within normal limits for age. There is no evidence of acute infarct, intracranial hemorrhage, mass, midline shift, or extra-axial collection.  The orbits are unremarkable. The visualized paranasal sinuses and mastoid air cells are clear. No skull fracture is identified.  IMPRESSION: Unremarkable head CT.  These results were called by telephone at the time of interpretation on 08/01/2014 at 10:28 pm to Dr. Leroy Kennedy, who verbally acknowledged these results.   Electronically Signed   By: Sebastian Ache   On: 08/01/2014 22:28   Mr Brain Wo Contrast  08/01/2014   CLINICAL DATA:  39 year old female with acute onset stuttering speech and left-sided numbness.  EXAM: MRI HEAD WITHOUT CONTRAST  TECHNIQUE: Multiplanar, multiecho pulse sequences of the brain and surrounding structures were obtained without intravenous contrast.  COMPARISON:  Prior CT from earlier the same day.  FINDINGS: The CSF containing spaces are within normal limits for patient age. No focal parenchymal signal abnormality is identified. No mass lesion, midline shift, or extra-axial fluid collection. Ventricles are normal in size without evidence of hydrocephalus.  No diffusion-weighted signal abnormality is identified to suggest acute intracranial infarct. Gray-white matter differentiation is maintained. Normal flow voids are seen within the intracranial vasculature. No intracranial hemorrhage identified.  The cervicomedullary junction is normal. Pituitary gland is within normal limits. Pituitary stalk is midline. The globes and optic nerves demonstrate a normal appearance with normal signal intensity.  The bone marrow signal intensity is normal. Calvarium is intact. Visualized upper cervical spine is within normal limits.  Scalp soft tissues are unremarkable.  Paranasal sinuses are clear.  No mastoid effusion.  IMPRESSION: Normal MRI of the brain.   Electronically Signed   By: Rise Mu M.D.   On: 08/01/2014 23:59      EKG: Independently reviewed. Normal Sinus Rhythm rate =64 No Acute S-T Changes   Assessment/Plan:   39 y.o. female with  Active Problems:   1.   TIA- Negative CT Head, and MRI/MRA are Negative    TIA Workup   Neuro Checks   Carotid US and 2D ECHO in AM   Check Fasting  Lipids, and HbA1c     2.   Aphasia- Intermittent Dysarthria, Expressive Aphasia- ? Medication reaction to Steroid Rx   Monitor        3.   Chest pain- due to Asthma, however check Troponins   Monitor on Telemetry     4.   Asthma   Duonebs   O2 PRN      5.   Crohn's disease   stable     6.   History of seizure   stable     7.   Leukocytosis- Stress rxn   Monitor Trend       8.   DVT Prophylaxis   Lovenox          Code Status:     FULL CODE       Family Communication:  Mother and Sister at Bedside Disposition Plan:    Observation Status        Time spent:  57 Minutes      Ron Parker [facility] Pager [phone]   If 7AM -7PM Please Contact the Day Rounding Team MD for [facility]  If 7PM-7AM, Please Contact Night-Floor Coverage  [email or web address] Password [email or web address] 08/02/2014, 1:29 AM     ADDENDUM:   Patient was seen and examined on 08/02/2014

## 2014-08-02 NOTE — Progress Notes (Signed)
PATIENT DETAILS Name: Cheryl Little Age: 39 y.o. Sex: female Date of Birth: Mar 06, 1976 Admit Date: 08/01/2014 Admitting Physician Ron Parker, MD PCP:No primary care provider on file.  Subjective: Speech normal this am-mother at bedside. Seen lifting left arm spontaneously-also seen by this MD to pull herself up on bed using her left upper ext ext-but claims that left arm is weak!!  Assessment/Plan: Active Problems:   Stuttering of speech/left sided weakness: inconsistent exam-as noted above-seen by this MD this am-saw her lift her left arm, also observed to pull her self up to sit in bed using her left upper ext-MRI Brain negative for CVA. Reviewed Neuro consult-recommended no further work up if MRI brain negative. Subsequently spoke with Neuro (Dr Wonda Cheng has already signed off-no further recommendations. Current suspicion for a functional disorder. Have asked RN to advance diet, ambulate-supportive care, reassurance-and  Discharge home later.  Aphasia   Asthma:lungs clear-no wheezing-doubt asthma flare    History of seizure:claims seizure free for >1 year-and that her PCP weaned her off Depakote. 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  MEDICATIONS: Scheduled Meds: .  stroke: mapping our early stages of recovery book   Does not apply Once  . enoxaparin (LOVENOX) injection  40 mg Subcutaneous Q24H   Continuous Infusions:  PRN Meds:.ipratropium-albuterol    PHYSICAL EXAM: Vital signs in last 24 hours: Filed  Vitals:   08/02/14 0400 08/02/14 0600 08/02/14 0800 08/02/14 0844  BP: 111/72 123/81 129/85   Pulse: 58 63 71 63  Temp: 98.5 F (36.9 C) 98.5 F (36.9 C) 98.4 F (36.9 C)   TempSrc: Oral Oral Oral   Resp: 18 18 20 18   Height:      Weight:  96.206 kg (212 lb 1.5 oz)    SpO2: 93% 94% 100% 100%    Weight change:  Filed Weights   08/01/14 2224 08/02/14 0200 08/02/14 0600  Weight: 94.802 kg (209 lb) 96.026 kg (211 lb 11.2 oz) 96.206 kg (212 lb 1.5 oz)   Body mass index is 35.29 kg/(m^2).   Gen Exam: Awake and alert with clear speech.   Neck: Supple, No JVD.   Chest: B/L Clear.   CVS: S1 S2 Regular, no murmurs.  Abdomen: soft, BS +, non tender, non distended.  Extremities: no edema, lower extremities warm to touch. Neurologic: Very mild left upper ext weakness-inconsistent exam-see above Skin: No Rash.   Wounds: N/A.   Intake/Output from previous day:  Intake/Output Summary (Last 24 hours) at 08/02/14 0954 Last data filed at 08/02/14 0824  Gross per 24 hour  Intake      0 ml  Output    350 ml  Net   -350 ml     LAB RESULTS: CBC  Recent Labs Lab 08/01/14 2210 08/01/14 2221  WBC 19.5*  --   HGB 13.2 14.6  HCT 39.8 43.0  PLT 251  --   MCV 90.0  --   MCH 29.9  --   MCHC 33.2  --  RDW 13.5  --   LYMPHSABS 3.2  --   MONOABS 1.4*  --   EOSABS 0.0  --   BASOSABS 0.0  --     Chemistries   Recent Labs Lab 08/01/14 2210 08/01/14 2221  NA 138 143  K 4.3 4.4  CL 104 101  CO2 28  --   GLUCOSE 92 90  BUN 13 15  CREATININE 1.03* 1.10*  CALCIUM 9.5  --     CBG: No results for input(s): GLUCAP in the last 168 hours.  GFR Estimated Creatinine Clearance: 80.4 mL/min (by C-G formula based on Cr of 1.1).  Coagulation profile  Recent Labs Lab 08/01/14 2210  INR 1.01    Cardiac Enzymes  Recent Labs Lab 08/02/14 0336  TROPONINI <0.03    Invalid input(s): POCBNP No results for input(s): DDIMER in the last 72 hours. No results for input(s):  HGBA1C in the last 72 hours.  Recent Labs  08/02/14 0645  CHOL 189  HDL 45  LDLCALC 116*  TRIG 140  CHOLHDL 4.2   No results for input(s): TSH, T4TOTAL, T3FREE, THYROIDAB in the last 72 hours.  Invalid input(s): FREET3 No results for input(s): VITAMINB12, FOLATE, FERRITIN, TIBC, IRON, RETICCTPCT in the last 72 hours. No results for input(s): LIPASE, AMYLASE in the last 72 hours.  Urine Studies No results for input(s): UHGB, CRYS in the last 72 hours.  Invalid input(s): UACOL, UAPR, USPG, UPH, UTP, UGL, UKET, UBIL, UNIT, UROB, ULEU, UEPI, UWBC, URBC, UBAC, CAST, UCOM, BILUA  MICROBIOLOGY: No results found for this or any previous visit (from the past 240 hour(s)).  RADIOLOGY STUDIES/RESULTS: Ct Head Wo Contrast  08/01/2014   CLINICAL DATA:  Code stroke. Slurred speech and left-sided weakness.  EXAM: CT HEAD WITHOUT CONTRAST  TECHNIQUE: Contiguous axial images were obtained from the base of the skull through the vertex without intravenous contrast.  COMPARISON:  None.  FINDINGS: The ventricles and sulci are within normal limits for age. There is no evidence of acute infarct, intracranial hemorrhage, mass, midline shift, or extra-axial collection.  The orbits are unremarkable. The visualized paranasal sinuses and mastoid air cells are clear. No skull fracture is identified.  IMPRESSION: Unremarkable head CT.  These results were called by telephone at the time of interpretation on 08/01/2014 at 10:28 pm to Dr. Leroy Kennedy, who verbally acknowledged these results.   Electronically Signed   By: Sebastian Ache   On: 08/01/2014 22:28   Mr Brain Wo Contrast  08/01/2014   CLINICAL DATA:  39 year old female with acute onset stuttering speech and left-sided numbness.  EXAM: MRI HEAD WITHOUT CONTRAST  TECHNIQUE: Multiplanar, multiecho pulse sequences of the brain and surrounding structures were obtained without intravenous contrast.  COMPARISON:  Prior CT from earlier the same day.  FINDINGS: The CSF  containing spaces are within normal limits for patient age. No focal parenchymal signal abnormality is identified. No mass lesion, midline shift, or extra-axial fluid collection. Ventricles are normal in size without evidence of hydrocephalus.  No diffusion-weighted signal abnormality is identified to suggest acute intracranial infarct. Gray-white matter differentiation is maintained. Normal flow voids are seen within the intracranial vasculature. No intracranial hemorrhage identified.  The cervicomedullary junction is normal. Pituitary gland is within normal limits. Pituitary stalk is midline. The globes and optic nerves demonstrate a normal appearance with normal signal intensity.  The bone marrow signal intensity is normal. Calvarium is intact. Visualized upper cervical spine is within normal limits.  Scalp soft tissues are unremarkable.  Paranasal  sinuses are clear.  No mastoid effusion.  IMPRESSION: Normal MRI of the brain.   Electronically Signed   By: Rise Mu M.D.   On: 08/01/2014 23:59    Jeoffrey Massed, MD  [facility] Pager:[phone]  If 7PM-7AM, please contact night-coverage [email or web address] Password [facility] 08/02/2014, 9:54 AM

## 2014-08-02 NOTE — Progress Notes (Signed)
Patient had one episode of approx 2-8min of stuttering when conversing with nurse around 3am. No further episodes noted. [facility] [employer]

## 2014-08-03 LAB — HEMOGLOBIN A1C
Hgb A1c MFr Bld: 5.8 % — ABNORMAL HIGH (ref 4.8–5.6)
Mean Plasma Glucose: 120 mg/dL

## 2020-05-15 ENCOUNTER — Emergency Department (HOSPITAL_COMMUNITY): Payer: 59

## 2020-05-15 ENCOUNTER — Encounter (HOSPITAL_COMMUNITY): Payer: Self-pay

## 2020-05-15 ENCOUNTER — Emergency Department (HOSPITAL_COMMUNITY)
Admission: EM | Admit: 2020-05-15 | Discharge: 2020-05-16 | Disposition: A | Discharge status: Home / Self Care | Payer: 59 | Attending: Emergency Medicine | Admitting: Emergency Medicine

## 2020-05-15 ENCOUNTER — Other Ambulatory Visit: Payer: Self-pay

## 2020-05-15 DIAGNOSIS — E119 Type 2 diabetes mellitus without complications: Secondary | ICD-10-CM | POA: Diagnosis not present

## 2020-05-15 DIAGNOSIS — K7689 Other specified diseases of liver: Secondary | ICD-10-CM | POA: Diagnosis not present

## 2020-05-15 DIAGNOSIS — R1011 Right upper quadrant pain: Secondary | ICD-10-CM

## 2020-05-15 DIAGNOSIS — R109 Unspecified abdominal pain: Secondary | ICD-10-CM | POA: Insufficient documentation

## 2020-05-15 DIAGNOSIS — R110 Nausea: Secondary | ICD-10-CM | POA: Diagnosis not present

## 2020-05-15 DIAGNOSIS — Z91040 Latex allergy status: Secondary | ICD-10-CM | POA: Diagnosis not present

## 2020-05-15 DIAGNOSIS — J45909 Unspecified asthma, uncomplicated: Secondary | ICD-10-CM | POA: Insufficient documentation

## 2020-05-15 DIAGNOSIS — Z90710 Acquired absence of both cervix and uterus: Secondary | ICD-10-CM | POA: Diagnosis not present

## 2020-05-15 DIAGNOSIS — I10 Essential (primary) hypertension: Secondary | ICD-10-CM | POA: Insufficient documentation

## 2020-05-15 DIAGNOSIS — R82998 Other abnormal findings in urine: Secondary | ICD-10-CM | POA: Diagnosis not present

## 2020-05-15 HISTORY — DX: Sleep apnea, unspecified: G47.30

## 2020-05-15 HISTORY — DX: Migraine with aura, not intractable, without status migrainosus: G43.109

## 2020-05-15 HISTORY — DX: Unspecified convulsions: R56.9

## 2020-05-15 HISTORY — DX: Type 2 diabetes mellitus without complications: E11.9

## 2020-05-15 HISTORY — DX: Fatty (change of) liver, not elsewhere classified: K76.0

## 2020-05-15 HISTORY — DX: Essential (primary) hypertension: I10

## 2020-05-15 HISTORY — DX: Barrett's esophagus without dysplasia: K22.70

## 2020-05-15 HISTORY — DX: Todd's paralysis (postepileptic): G83.84

## 2020-05-15 HISTORY — DX: Unspecified asthma, uncomplicated: J45.909

## 2020-05-15 HISTORY — DX: Migraine, unspecified, not intractable, without status migrainosus: G43.909

## 2020-05-15 LAB — HEPATIC FUNCTION PANEL
ALT: 21 U/L (ref 0–44)
AST: 21 U/L (ref 15–41)
Albumin: 3.9 g/dL (ref 3.5–5.0)
Alkaline Phosphatase: 62 U/L (ref 38–126)
Bilirubin, Direct: <0.1 mg/dL (ref 0.0–0.2)
Total Bilirubin: 0.3 mg/dL (ref 0.3–1.2)
Total Protein: 6.8 g/dL (ref 6.5–8.1)

## 2020-05-15 LAB — URINALYSIS, ROUTINE W REFLEX MICROSCOPIC
Bilirubin Urine: NEGATIVE
Glucose, UA: NEGATIVE mg/dL
Hgb urine dipstick: NEGATIVE
Ketones, ur: NEGATIVE mg/dL
Nitrite: NEGATIVE
Protein, ur: NEGATIVE mg/dL
Specific Gravity, Urine: 1.023 (ref 1.005–1.030)
pH: 7 (ref 5.0–8.0)

## 2020-05-15 LAB — CBC WITH DIFFERENTIAL/PLATELET
Abs Immature Granulocytes: 0.01 10*3/uL (ref 0.00–0.07)
Basophils Absolute: 0.1 10*3/uL (ref 0.0–0.1)
Basophils Relative: 1 %
Eosinophils Absolute: 0.3 10*3/uL (ref 0.0–0.5)
Eosinophils Relative: 5 %
HCT: 40.4 % (ref 36.0–46.0)
Hemoglobin: 12.9 g/dL (ref 12.0–15.0)
Immature Granulocytes: 0 %
Lymphocytes Relative: 50 %
Lymphs Abs: 3.4 10*3/uL (ref 0.7–4.0)
MCH: 29.9 pg (ref 26.0–34.0)
MCHC: 31.9 g/dL (ref 30.0–36.0)
MCV: 93.7 fL (ref 80.0–100.0)
Monocytes Absolute: 0.5 10*3/uL (ref 0.1–1.0)
Monocytes Relative: 7 %
Neutro Abs: 2.6 10*3/uL (ref 1.7–7.7)
Neutrophils Relative %: 37 %
Platelets: 256 10*3/uL (ref 150–400)
RBC: 4.31 MIL/uL (ref 3.87–5.11)
RDW: 12.8 % (ref 11.5–15.5)
WBC: 6.8 10*3/uL (ref 4.0–10.5)
nRBC: 0 % (ref 0.0–0.2)

## 2020-05-15 LAB — BASIC METABOLIC PANEL
Anion gap: 7 (ref 5–15)
BUN: 17 mg/dL (ref 6–20)
CO2: 27 mmol/L (ref 22–32)
Calcium: 9 mg/dL (ref 8.9–10.3)
Chloride: 105 mmol/L (ref 98–111)
Creatinine, Ser: 0.89 mg/dL (ref 0.44–1.00)
GFR, Estimated: >60 mL/min (ref >60)
Glucose, Bld: 85 mg/dL (ref 70–99)
Potassium: 3.7 mmol/L (ref 3.5–5.1)
Sodium: 139 mmol/L (ref 135–145)

## 2020-05-15 LAB — LIPASE, BLOOD: Lipase: 30 U/L (ref 11–51)

## 2020-05-15 MED ORDER — IOHEXOL 300 MG/ML  SOLN
100.0000 mL | Freq: Once | INTRAMUSCULAR | Status: AC | PRN
  Administered 2020-05-16: 100 mL via INTRAVENOUS

## 2020-05-15 MED ORDER — SODIUM CHLORIDE 0.9 % IV SOLN
1.0000 g | Freq: Once | INTRAVENOUS | Status: AC
  Administered 2020-05-16: 1 g via INTRAVENOUS
  Filled 2020-05-15: qty 10

## 2020-05-15 MED ORDER — KETOROLAC TROMETHAMINE 30 MG/ML IJ SOLN
30.0000 mg | Freq: Once | INTRAMUSCULAR | Status: AC
  Administered 2020-05-16: 30 mg via INTRAVENOUS
  Filled 2020-05-15: qty 1

## 2020-05-15 NOTE — ED Provider Notes (Signed)
[facility] EMERGENCY DEPARTMENT Provider Note   CSN: [id number] Arrival date & time: 05/15/20  2111     History Chief Complaint  Patient presents with   Flank Pain    Cheryl Little is a 45 y.o. female with a history of diabetes, nonalcoholic fatty liver disease, seizures, Barrett's esophagus, presented to emergency department with right flank pain. She reports gradual onset of symptoms yesterday of been steadily getting worse. It seems to come and go at times. She was feeling it mostly in her right flank but now her right lower back, and up towards her right shoulder blade. She has not had this kind of pain before. She reports that her urine has smelled very "funky" for the past day. But she denies dysuria or hematuria or history of kidney stones.  She reports nausea. Denies diarrhea.  Her only abdominal surgical history was a partial hysterectomy to have her uterus and one ovary removed for uterine cancer when she was in her 62s.  HPI     Past Medical History:  Diagnosis Date   Asthma    Barrett esophagus    Diabetes mellitus without complication (HCC)    Hypertension    Migraine    Non-alcoholic fatty liver disease    Ocular migraine    Seizures (HCC)    Sleep apnea    Todd's paralysis (HCC)     There are no problems to display for this patient.   Past Surgical History:  Procedure Laterality Date   PARTIAL HYSTERECTOMY       OB History   No obstetric history on file.     History reviewed. No pertinent family history.  Social History   Tobacco Use   Smoking status: Never Smoker   Smokeless tobacco: Never Used  Vaping Use   Vaping Use: Never used  Substance Use Topics   Alcohol use: Never   Drug use: Never    Home Medications Prior to Admission medications   Not on File    Allergies    Aspirin, Latex, and Vicodin hp [hydrocodone-acetaminophen]  Review of Systems   Review of Systems  Constitutional: Negative for  chills and fever.  HENT: Negative for ear pain and sore throat.   Eyes: Negative for pain and visual disturbance.  Respiratory: Negative for cough and shortness of breath.   Cardiovascular: Negative for chest pain and palpitations.  Gastrointestinal: Positive for abdominal pain and nausea. Negative for vomiting.  Genitourinary: Positive for dysuria and flank pain.  Musculoskeletal: Positive for back pain. Negative for arthralgias.  Skin: Negative for color change and rash.  Neurological: Negative for syncope and light-headedness.  All other systems reviewed and are negative.   Physical Exam Updated Vital Signs BP (!) 137/91    Pulse 72    Temp 98.1 F (36.7 C) (Oral)    Resp 18    Ht 5\' 5"  (1.651 m)    Wt 101.2 kg    SpO2 100%    BMI 37.11 kg/m   Physical Exam Constitutional:      General: She is not in acute distress.    Appearance: She is obese.  HENT:     Head: Normocephalic and atraumatic.  Eyes:     Conjunctiva/sclera: Conjunctivae normal.     Pupils: Pupils are equal, round, and reactive to light.  Cardiovascular:     Rate and Rhythm: Normal rate and regular rhythm.  Pulmonary:     Effort: Pulmonary effort is normal. No respiratory distress.  Abdominal:  General: There is no distension.     Tenderness: There is abdominal tenderness in the right lower quadrant and suprapubic area. There is right CVA tenderness. Negative signs include Murphy's sign and McBurney's sign.  Skin:    General: Skin is warm and dry.  Neurological:     General: No focal deficit present.     Mental Status: She is alert. Mental status is at baseline.  Psychiatric:        Mood and Affect: Mood normal.        Behavior: Behavior normal.     ED Results / Procedures / Treatments   Labs (all labs ordered are listed, but only abnormal results are displayed) Labs Reviewed - No data to display  EKG None  Radiology No results found.  Procedures Procedures   Medications Ordered in  ED Medications - No data to display  ED Course  I have reviewed the triage vital signs and the nursing notes.  Pertinent labs & imaging results that were available during my care of the patient were reviewed by me and considered in my medical decision making (see chart for details).  This patient presents to the Emergency Department with complaint of abdominal pain. This involves an extensive number of treatment options, and is a complaint that carries with it a high risk of complications and morbidity.  The differential diagnosis includes gastritis vs biliary disease vs pancreatitis vs UTI/pyelonephritis vs muscular strain vs kidney stone vs colitis vs other  I ordered, reviewed, and interpreted labs which are unremarkable - no leukoytosis, LFT's normal, Lipase wnl.   UA with +leuks, bacteria, WBC - in the setting of dysuria this is consistent with UTI.  IV rocephin ordered for empiric coverage for possible Pyelo, given her right CVA tenderness.  I ordered medication IV toradol for abdominal pain I ordered imaging studies which included CT abdomen/pelvis  Patient signed out to Dr Preston Fleeting overnight ED at 11:30 pm pending CT scan to evaluate for intraabdominal infection.     Final Clinical Impression(s) / ED Diagnoses Final diagnoses:  None    Rx / DC Orders ED Discharge Orders    None       Dub Maclellan, Kermit Balo, MD 05/16/20 [phone]

## 2020-05-15 NOTE — ED Provider Notes (Signed)
Care assumed from Dr. Paulla Dolly, patient with flank pain and suspicious for pyelonephritis pending CT scan.  CT scan shows no evidence of acute intra-abdominal pathology.  Patient states she is feeling better after ketorolac.  On exam, she is still markedly tender in the right upper quadrant.  She will be discharged with prescription for cephalexin but advised to stop taking it if the urine culture comes back showing no infection.  She will be brought back in the morning for right upper quadrant ultrasound.  Advised that this may represent shingles and told to see a physician as soon as possible if she breaks out in a rash in the area where she is having her pain.  Results for orders placed or performed during the hospital encounter of 05/15/20  Urinalysis, Routine w reflex microscopic Urine, Clean Catch  Result Value Ref Range   Color, Urine YELLOW YELLOW   APPearance HAZY (A) CLEAR   Specific Gravity, Urine 1.023 1.005 - 1.030   pH 7.0 5.0 - 8.0   Glucose, UA NEGATIVE NEGATIVE mg/dL   Hgb urine dipstick NEGATIVE NEGATIVE   Bilirubin Urine NEGATIVE NEGATIVE   Ketones, ur NEGATIVE NEGATIVE mg/dL   Protein, ur NEGATIVE NEGATIVE mg/dL   Nitrite NEGATIVE NEGATIVE   Leukocytes,Ua MODERATE (A) NEGATIVE   RBC / HPF 0-5 0 - 5 RBC/hpf   WBC, UA 6-10 0 - 5 WBC/hpf   Bacteria, UA RARE (A) NONE SEEN   Squamous Epithelial / LPF 21-50 0 - 5   Mucus PRESENT   Basic metabolic panel  Result Value Ref Range   Sodium 139 135 - 145 mmol/L   Potassium 3.7 3.5 - 5.1 mmol/L   Chloride 105 98 - 111 mmol/L   CO2 27 22 - 32 mmol/L   Glucose, Bld 85 70 - 99 mg/dL   BUN 17 6 - 20 mg/dL   Creatinine, Ser 9.38 0.44 - 1.00 mg/dL   Calcium 9.0 8.9 - 10.1 mg/dL   GFR, Estimated >75 >10 mL/min   Anion gap 7 5 - 15  Hepatic function panel  Result Value Ref Range   Total Protein 6.8 6.5 - 8.1 g/dL   Albumin 3.9 3.5 - 5.0 g/dL   AST 21 15 - 41 U/L   ALT 21 0 - 44 U/L   Alkaline Phosphatase 62 38 - 126 U/L    Total Bilirubin 0.3 0.3 - 1.2 mg/dL   Bilirubin, Direct <2.5 0.0 - 0.2 mg/dL   Indirect Bilirubin NOT CALCULATED 0.3 - 0.9 mg/dL  Lipase, blood  Result Value Ref Range   Lipase 30 11 - 51 U/L  CBC with Differential  Result Value Ref Range   WBC 6.8 4.0 - 10.5 K/uL   RBC 4.31 3.87 - 5.11 MIL/uL   Hemoglobin 12.9 12.0 - 15.0 g/dL   HCT 85.2 77.8 - 24.2 %   MCV 93.7 80.0 - 100.0 fL   MCH 29.9 26.0 - 34.0 pg   MCHC 31.9 30.0 - 36.0 g/dL   RDW 35.3 61.4 - 43.1 %   Platelets 256 150 - 400 K/uL   nRBC 0.0 0.0 - 0.2 %   Neutrophils Relative % 37 %   Neutro Abs 2.6 1.7 - 7.7 K/uL   Lymphocytes Relative 50 %   Lymphs Abs 3.4 0.7 - 4.0 K/uL   Monocytes Relative 7 %   Monocytes Absolute 0.5 0.1 - 1.0 K/uL   Eosinophils Relative 5 %   Eosinophils Absolute 0.3 0.0 - 0.5 K/uL   Basophils  Relative 1 %   Basophils Absolute 0.1 0.0 - 0.1 K/uL   Immature Granulocytes 0 %   Abs Immature Granulocytes 0.01 0.00 - 0.07 K/uL   CT ABDOMEN PELVIS W CONTRAST  Result Date: 05/16/2020 CLINICAL DATA:  Right flank pain. EXAM: CT ABDOMEN AND PELVIS WITH CONTRAST TECHNIQUE: Multidetector CT imaging of the abdomen and pelvis was performed using the standard protocol following bolus administration of intravenous contrast. CONTRAST:  OMNIPAQUE IOHEXOL 300 MG/ML  SOLN COMPARISON:  None. FINDINGS: Lower chest: No acute abnormality. Hepatobiliary: A 1.5 cm diameter cyst is seen within the anterior aspect of the right lobe of the liver. No gallstones, gallbladder wall thickening, or biliary dilatation. Pancreas: Unremarkable. No pancreatic ductal dilatation or surrounding inflammatory changes. Spleen: Normal in size without focal abnormality. Adrenals/Urinary Tract: Adrenal glands are unremarkable. Kidneys are normal, without renal calculi, focal lesion, or hydronephrosis. Bladder is unremarkable. Stomach/Bowel: Stomach is within normal limits. Appendix appears normal. No evidence of bowel wall thickening,  distention, or inflammatory changes. Vascular/Lymphatic: No significant vascular findings are present. No enlarged abdominal or pelvic lymph nodes. Reproductive: Status post hysterectomy. No adnexal masses. Other: No abdominal wall hernia or abnormality. No abdominopelvic ascites. Musculoskeletal: No acute or significant osseous findings. IMPRESSION: 1. Small hepatic cyst. 2. Status post hysterectomy. Electronically Signed   By: Aram Candela M.D.   On: 05/16/2020 02:07      Dione Booze, MD 05/16/20 [phone]

## 2020-05-15 NOTE — ED Triage Notes (Signed)
Pt to er, pt states that two days ago she was sitting at work and started having some R flank pain, states that she continued to have the pain and the pain seems to be getting worse, states that now the pain is radiating up into her back.

## 2020-05-16 DIAGNOSIS — K7689 Other specified diseases of liver: Secondary | ICD-10-CM | POA: Diagnosis not present

## 2020-05-16 DIAGNOSIS — Z91040 Latex allergy status: Secondary | ICD-10-CM | POA: Diagnosis not present

## 2020-05-16 DIAGNOSIS — R110 Nausea: Secondary | ICD-10-CM | POA: Diagnosis not present

## 2020-05-16 DIAGNOSIS — R82998 Other abnormal findings in urine: Secondary | ICD-10-CM | POA: Diagnosis not present

## 2020-05-16 DIAGNOSIS — E119 Type 2 diabetes mellitus without complications: Secondary | ICD-10-CM | POA: Diagnosis not present

## 2020-05-16 DIAGNOSIS — I10 Essential (primary) hypertension: Secondary | ICD-10-CM | POA: Diagnosis not present

## 2020-05-16 DIAGNOSIS — R109 Unspecified abdominal pain: Secondary | ICD-10-CM | POA: Diagnosis not present

## 2020-05-16 DIAGNOSIS — J45909 Unspecified asthma, uncomplicated: Secondary | ICD-10-CM | POA: Diagnosis not present

## 2020-05-16 DIAGNOSIS — Z90710 Acquired absence of both cervix and uterus: Secondary | ICD-10-CM | POA: Diagnosis not present

## 2020-05-16 MED ORDER — CEPHALEXIN 500 MG PO CAPS: 500.0000 mg | ORAL_CAPSULE | Freq: Three times a day (TID) | ORAL | 0 refills | Status: DC

## 2020-05-16 NOTE — ED Notes (Signed)
Patient transported to CT 

## 2020-05-16 NOTE — Discharge Instructions (Signed)
Your CT scan did not show a cause for the pain.  Your urine shows there might be some infection.  You have been given a dose of an antibiotic in the emergency department and urine has been sent for culture.  Culture results should be back in 2 days.  You are being given a prescription for an antibiotic for the possible urinary tract infection.  If, the culture shows no evidence of infection, stop the antibiotic once the culture results are available.  Please return in the morning for an ultrasound to look to see if you have gallstones.  It is possible that this pain is related to shingles.  If you break out in a rash where you are hurting, return to the emergency department or see your primary care provider to get started on medication which is very specific for shingles.

## 2020-05-17 ENCOUNTER — Encounter (HOSPITAL_COMMUNITY): Payer: Self-pay

## 2020-05-17 LAB — URINE CULTURE

## 2020-05-22 DIAGNOSIS — R079 Chest pain, unspecified: Secondary | ICD-10-CM | POA: Diagnosis not present

## 2020-05-22 DIAGNOSIS — Z79899 Other long term (current) drug therapy: Secondary | ICD-10-CM | POA: Diagnosis not present

## 2020-05-22 DIAGNOSIS — J45909 Unspecified asthma, uncomplicated: Secondary | ICD-10-CM | POA: Diagnosis not present

## 2020-05-22 DIAGNOSIS — J4541 Moderate persistent asthma with (acute) exacerbation: Secondary | ICD-10-CM | POA: Diagnosis not present

## 2020-05-22 DIAGNOSIS — R490 Dysphonia: Secondary | ICD-10-CM | POA: Diagnosis not present

## 2020-05-22 DIAGNOSIS — N390 Urinary tract infection, site not specified: Secondary | ICD-10-CM | POA: Diagnosis not present

## 2020-05-22 DIAGNOSIS — E669 Obesity, unspecified: Secondary | ICD-10-CM | POA: Diagnosis not present

## 2020-05-22 DIAGNOSIS — Z8616 Personal history of COVID-19: Secondary | ICD-10-CM | POA: Diagnosis not present

## 2020-05-22 DIAGNOSIS — Z720 Tobacco use: Secondary | ICD-10-CM | POA: Diagnosis not present

## 2020-05-22 DIAGNOSIS — Z5181 Encounter for therapeutic drug level monitoring: Secondary | ICD-10-CM | POA: Diagnosis not present

## 2020-05-22 DIAGNOSIS — M791 Myalgia, unspecified site: Secondary | ICD-10-CM | POA: Diagnosis not present

## 2020-06-29 ENCOUNTER — Ambulatory Visit (HOSPITAL_BASED_OUTPATIENT_CLINIC_OR_DEPARTMENT_OTHER): Payer: 59 | Admitting: Family Medicine

## 2020-06-29 ENCOUNTER — Encounter (HOSPITAL_BASED_OUTPATIENT_CLINIC_OR_DEPARTMENT_OTHER): Payer: Self-pay

## 2020-07-07 ENCOUNTER — Encounter (HOSPITAL_BASED_OUTPATIENT_CLINIC_OR_DEPARTMENT_OTHER): Payer: Self-pay | Admitting: Family Medicine

## 2020-08-05 DIAGNOSIS — R569 Unspecified convulsions: Secondary | ICD-10-CM | POA: Diagnosis not present

## 2020-08-06 ENCOUNTER — Ambulatory Visit (INDEPENDENT_AMBULATORY_CARE_PROVIDER_SITE_OTHER): Payer: 59 | Admitting: Family Medicine

## 2020-08-06 ENCOUNTER — Encounter: Payer: Self-pay | Admitting: Family Medicine

## 2020-08-06 ENCOUNTER — Other Ambulatory Visit: Payer: Self-pay

## 2020-08-06 DIAGNOSIS — E1169 Type 2 diabetes mellitus with other specified complication: Secondary | ICD-10-CM

## 2020-08-06 DIAGNOSIS — J4540 Moderate persistent asthma, uncomplicated: Secondary | ICD-10-CM | POA: Diagnosis not present

## 2020-08-06 DIAGNOSIS — E559 Vitamin D deficiency, unspecified: Secondary | ICD-10-CM | POA: Diagnosis not present

## 2020-08-06 DIAGNOSIS — I10 Essential (primary) hypertension: Secondary | ICD-10-CM | POA: Diagnosis not present

## 2020-08-06 DIAGNOSIS — E538 Deficiency of other specified B group vitamins: Secondary | ICD-10-CM | POA: Diagnosis not present

## 2020-08-06 DIAGNOSIS — E669 Obesity, unspecified: Secondary | ICD-10-CM

## 2020-08-06 DIAGNOSIS — E782 Mixed hyperlipidemia: Secondary | ICD-10-CM

## 2020-08-06 MED ORDER — ERGOCALCIFEROL 1.25 MG (50000 UT) PO CAPS: 50000.0000 [IU] | ORAL_CAPSULE | ORAL | 4 refills | Status: AC

## 2020-08-06 MED ORDER — CYANOCOBALAMIN 1000 MCG/ML IJ SOLN: 1000.0000 ug | Freq: Once | INTRAMUSCULAR | Status: AC

## 2020-08-06 MED ORDER — CYANOCOBALAMIN 1000 MCG/ML IJ SOLN: 1000.0000 ug | Freq: Once | INTRAMUSCULAR | Status: DC

## 2020-08-06 MED ORDER — ALBUTEROL SULFATE (2.5 MG/3ML) 0.083% IN NEBU: 2.5000 mg | INHALATION_SOLUTION | Freq: Four times a day (QID) | RESPIRATORY_TRACT | 1 refills | Status: AC | PRN

## 2020-08-06 MED ORDER — TRELEGY ELLIPTA 100-62.5-25 MCG/INH IN AEPB: 1.0000 | INHALATION_SPRAY | Freq: Every day | RESPIRATORY_TRACT | 11 refills | Status: AC

## 2020-08-06 NOTE — Assessment & Plan Note (Signed)
Diabetes related complications: peripheral neuropathy. Discussed general issues about diabetes pathophysiology and management. Unknown A1C due to new patient status, diet not at goal.

## 2020-08-07 LAB — COMPLETE METABOLIC PANEL WITH GFR
AG Ratio: 1.6 (calc) (ref 1.0–2.5)
ALT: 35 U/L — ABNORMAL HIGH (ref 6–29)
AST: 21 U/L (ref 10–30)
Albumin: 4.3 g/dL (ref 3.6–5.1)
Alkaline phosphatase (APISO): 69 U/L (ref 31–125)
BUN: 16 mg/dL (ref 7–25)
CO2: 29 mmol/L (ref 20–32)
Calcium: 9.5 mg/dL (ref 8.6–10.2)
Chloride: 105 mmol/L (ref 98–110)
Creat: 0.94 mg/dL (ref 0.50–1.10)
GFR, Est African American: 86 mL/min/{1.73_m2} (ref >=60)
GFR, Est Non African American: 74 mL/min/{1.73_m2} (ref >=60)
Globulin: 2.7 g/dL (calc) (ref 1.9–3.7)
Glucose, Bld: 75 mg/dL (ref 65–99)
Potassium: 4.2 mmol/L (ref 3.5–5.3)
Sodium: 142 mmol/L (ref 135–146)
Total Bilirubin: 0.4 mg/dL (ref 0.2–1.2)
Total Protein: 7 g/dL (ref 6.1–8.1)

## 2020-08-07 LAB — HEMOGLOBIN A1C
Hgb A1c MFr Bld: 5.5 % of total Hgb (ref <5.7)
Mean Plasma Glucose: 111 mg/dL
eAG (mmol/L): 6.2 mmol/L

## 2020-08-13 NOTE — Assessment & Plan Note (Signed)
Patient's blood pressure is within the desired range. Medication side effects include: no side effects noted Continue current treatment regimen. Continue current medications.  

## 2020-08-13 NOTE — Progress Notes (Signed)
Established Patient Office Visit  SUBJECTIVE:  Subjective  Patient ID: Cheryl Little, female    DOB: 07/11/1975  Age: 45 y.o. MRN: [id number]  CC:  Chief Complaint  Patient presents with  . New Patient (Initial Visit)    HPI Cheryl Little is a 45 y.o. female presenting today for     Past Medical History:  Diagnosis Date  . Asthma   . Barrett esophagus   . Bronchitis   . Crohn's disease (HCC)   . Diabetes mellitus without complication (HCC)   . Hypertension   . Migraine   . Non-alcoholic fatty liver disease   . Ocular migraine   . Seizures (HCC)   . Sleep apnea   . Todd's paralysis [facility])     Past Surgical History:  Procedure Laterality Date  . PARTIAL HYSTERECTOMY      Family History  Problem Relation Age of Onset  . Heart disease Mother   . Hypertension Mother   . Heart disease Father   . Hypertension Father   . Diabetes Maternal Grandmother   . Heart disease Maternal Grandmother   . Hypertension Maternal Grandmother   . Stroke Maternal Grandmother   . Diabetes Maternal Grandfather   . Heart disease Maternal Grandfather   . Hypertension Maternal Grandfather   . Cancer Paternal Grandmother   . Diabetes Paternal Grandmother   . Heart disease Paternal Grandmother   . Hypertension Paternal Grandmother   . Diabetes Paternal Grandfather   . Heart disease Paternal Grandfather   . Hypertension Paternal Grandfather     Social History   Socioeconomic History  . Marital status: Divorced    Spouse name: Not on file  . Number of children: Not on file  . Years of education: Not on file  . Highest education level: Not on file  Occupational History  . Not on file  Tobacco Use  . Smoking status: Never Smoker  . Smokeless tobacco: Never Used  Vaping Use  . Vaping Use: Never used  Substance and Sexual Activity  . Alcohol use: Never  . Drug use: Never  . Sexual activity: Yes  Other Topics Concern  . Not on file  Social History Narrative   ** Merged  History Encounter **       Social Determinants of Health   Financial Resource Strain: Not on file  Food Insecurity: Not on file  Transportation Needs: Not on file  Physical Activity: Not on file  Stress: Not on file  Social Connections: Not on file  Intimate Partner Violence: Not on file     Current Outpatient Medications:  .  albuterol (PROVENTIL) (2.5 MG/3ML) 0.083% nebulizer solution, Take 3 mLs (2.5 mg total) by nebulization every 6 (six) hours as needed for wheezing or shortness of breath., Disp: 150 mL, Rfl: 1 .  ergocalciferol (VITAMIN D2) 1.25 MG (50000 UT) capsule, Take 1 capsule (50,000 Units total) by mouth once a week., Disp: 4 capsule, Rfl: 4 .  Fluticasone-Umeclidin-Vilant (TRELEGY ELLIPTA) 100-62.5-25 MCG/INH AEPB, Inhale 1 puff into the lungs daily., Disp: 1 each, Rfl: 11 .  lamoTRIgine (LAMICTAL) 25 MG tablet, Take 25 mg by mouth daily., Disp: , Rfl:   Current Facility-Administered Medications:  .  cyanocobalamin ((VITAMIN B-12)) injection 1,000 mcg, 1,000 mcg, Intramuscular, Once, Irish Lack, FNP   Allergies  Allergen Reactions  . Aspirin Anaphylaxis  . Aspirin Anaphylaxis  . Latex Other (See Comments)    Inward and outward burning  . Latex Other (See Comments)  . Vicodin Hp [  Hydrocodone-Acetaminophen] Other (See Comments)    ROS Review of Systems  Constitutional: Negative.   HENT: Negative.   Respiratory: Negative.   Gastrointestinal: Negative.   Genitourinary: Negative.   Musculoskeletal: Negative.   Psychiatric/Behavioral: Negative.      OBJECTIVE:    Physical Exam Vitals reviewed.  Constitutional:      Appearance: She is obese.  HENT:     Head: Normocephalic and atraumatic.  Cardiovascular:     Rate and Rhythm: Normal rate and regular rhythm.  Pulmonary:     Effort: Pulmonary effort is normal.     Breath sounds: Normal breath sounds.  Musculoskeletal:        General: Normal range of motion.  Skin:    General: Skin is warm.   Psychiatric:        Mood and Affect: Mood normal.     There were no vitals taken for this visit. Wt Readings from Last 3 Encounters:  05/15/20 223 lb (101.2 kg)  08/02/14 212 lb 1.5 oz (96.2 kg)    Health Maintenance Due  Topic Date Due  . COVID-19 Vaccine (1) Never done  . FOOT EXAM  Never done  . OPHTHALMOLOGY EXAM  Never done  . URINE MICROALBUMIN  Never done  . HIV Screening  Never done  . Hepatitis C Screening  Never done  . PAP SMEAR-Modifier  Never done    There are no preventive care reminders to display for this patient.  CBC Latest Ref Rng & Units 05/15/2020 08/01/2014 08/01/2014  WBC 4.0 - 10.5 K/uL 6.8 - 19.5(H)  Hemoglobin 12.0 - 15.0 g/dL 14.9 70.2 63.7  Hematocrit 36.0 - 46.0 % 40.4 43.0 39.8  Platelets 150 - 400 K/uL 256 - 251   CMP Latest Ref Rng & Units 08/06/2020 05/15/2020 08/01/2014  Glucose 65 - 99 mg/dL 75 85 90  BUN 7 - 25 mg/dL 16 17 15   Creatinine 0.50 - 1.10 mg/dL 8.58 8.50)  Sodium 135 - 146 mmol/L 142 139 143  Potassium 3.5 - 5.3 mmol/L 4.2 3.7 4.4  Chloride 98 - 110 mmol/L 105 105 101  CO2 20 - 32 mmol/L 29 27 -  Calcium 8.6 - 10.2 mg/dL 9.5 9.0 -  Total Protein 6.1 - 8.1 g/dL 7.0 6.8 -  Total Bilirubin 0.2 - 1.2 mg/dL 0.4 0.3 -  Alkaline Phos 38 - 126 U/L - 62 -  AST 10 - 30 U/L 21 21 -  ALT 6 - 29 U/L 35(H) 21 -    No results found for: TSH Lab Results  Component Value Date   ALBUMIN 3.9 05/15/2020   ANIONGAP 7 05/15/2020   Lab Results  Component Value Date   CHOL 189 08/02/2014   HDL 45 08/02/2014   LDLCALC 116 (H) 08/02/2014   CHOLHDL 4.2 08/02/2014   Lab Results  Component Value Date   TRIG 140 08/02/2014   Lab Results  Component Value Date   HGBA1C 5.5 08/06/2020   HGBA1C 5.8 (H) 08/02/2014      ASSESSMENT & PLAN:   Problem List Items Addressed This Visit      Cardiovascular and Mediastinum   Primary hypertension    Patient's blood pressure is within the desired range. Medication side effects include: no  side effects noted Continue current treatment regimen. Continue current medications.         Respiratory   Asthma - Primary    Taking all meds as rx, no acute asthma sx, sx controlled.  Relevant Medications   Fluticasone-Umeclidin-Vilant (TRELEGY ELLIPTA) 100-62.5-25 MCG/INH AEPB   albuterol (PROVENTIL) (2.5 MG/3ML) 0.083% nebulizer solution     Endocrine   Type 2 diabetes mellitus with obesity (HCC)    Diabetes related complications: peripheral neuropathy. Discussed general issues about diabetes pathophysiology and management. Unknown A1C due to new patient status, diet not at goal.        Relevant Orders   HgB A1c (Completed)   COMPLETE METABOLIC PANEL WITH GFR (Completed)     Other   Moderate mixed hyperlipidemia not requiring statin therapy    - Pt's HLD is stable on No meds. - Reviewed diet and exercise.       Vitamin D deficiency    Taking Vitamin D as rx      Vitamin B12 deficiency    Taking all meds      Relevant Medications   cyanocobalamin ((VITAMIN B-12)) injection 1,000 mcg      Meds ordered this encounter  Medications  . Fluticasone-Umeclidin-Vilant (TRELEGY ELLIPTA) 100-62.5-25 MCG/INH AEPB    Sig: Inhale 1 puff into the lungs daily.    Dispense:  1 each    Refill:  11  . albuterol (PROVENTIL) (2.5 MG/3ML) 0.083% nebulizer solution    Sig: Take 3 mLs (2.5 mg total) by nebulization every 6 (six) hours as needed for wheezing or shortness of breath.    Dispense:  150 mL    Refill:  1  . ergocalciferol (VITAMIN D2) 1.25 MG (50000 UT) capsule    Sig: Take 1 capsule (50,000 Units total) by mouth once a week.    Dispense:  4 capsule    Refill:  4  . DISCONTD: cyanocobalamin ((VITAMIN B-12)) injection 1,000 mcg  . cyanocobalamin ((VITAMIN B-12)) injection 1,000 mcg      Follow-up: No follow-ups on file.    Irish Lack, FNP [facility] [address] [place], [place] [address]

## 2020-08-13 NOTE — Assessment & Plan Note (Signed)
Taking all meds as rx, no acute asthma sx, sx controlled.

## 2020-08-13 NOTE — Assessment & Plan Note (Signed)
Taking all meds

## 2020-08-13 NOTE — Assessment & Plan Note (Signed)
Taking Vitamin D as rx

## 2020-08-13 NOTE — Assessment & Plan Note (Signed)
-   Pt's HLD is stable on No meds. - Reviewed diet and exercise.

## 2020-09-03 ENCOUNTER — Ambulatory Visit: Payer: 59 | Admitting: Family Medicine

## 2020-10-08 DIAGNOSIS — R569 Unspecified convulsions: Secondary | ICD-10-CM | POA: Diagnosis not present

## 2020-10-08 DIAGNOSIS — R400 Somnolence: Secondary | ICD-10-CM | POA: Diagnosis not present

## 2020-10-29 DIAGNOSIS — R569 Unspecified convulsions: Secondary | ICD-10-CM | POA: Diagnosis not present

## 2020-11-03 DIAGNOSIS — R569 Unspecified convulsions: Secondary | ICD-10-CM | POA: Diagnosis not present

## 2020-11-04 DIAGNOSIS — R569 Unspecified convulsions: Secondary | ICD-10-CM | POA: Diagnosis not present

## 2020-12-06 DIAGNOSIS — R569 Unspecified convulsions: Secondary | ICD-10-CM | POA: Diagnosis not present

## 2021-01-17 DIAGNOSIS — R110 Nausea: Secondary | ICD-10-CM | POA: Diagnosis not present

## 2021-01-17 DIAGNOSIS — R569 Unspecified convulsions: Secondary | ICD-10-CM | POA: Diagnosis not present

## 2022-02-09 IMAGING — CT CT ABD-PELV W/ CM
2 of 5 series · 17 of 46 positions shown, 19 images · IV contrast (omnipaque)
Comparison: None.

CLINICAL DATA: Right flank pain.

EXAM:
CT ABDOMEN AND PELVIS WITH CONTRAST
TECHNIQUE: Multidetector CT imaging of the abdomen and pelvis was performed
using the standard protocol following bolus administration of
intravenous contrast.
CONTRAST:  100mL OMNIPAQUE IOHEXOL 300 MG/ML  SOLN

[Series 2: axial st · axial · 0.80mm/px · z∈[+528,+938]mm · 14 of 92 slices shown, 16 images]
[im 5/92  soft-tissue]
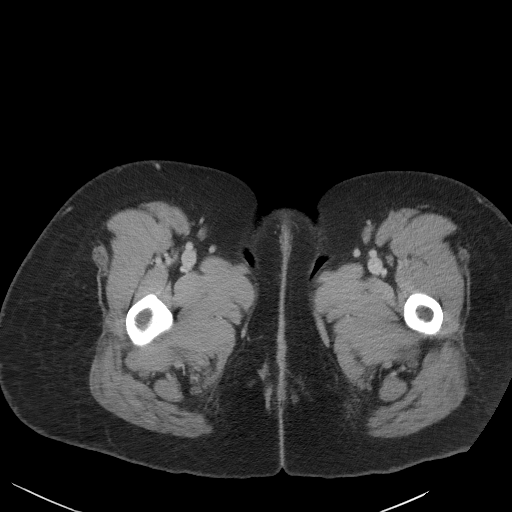
[im 5/92  bone]
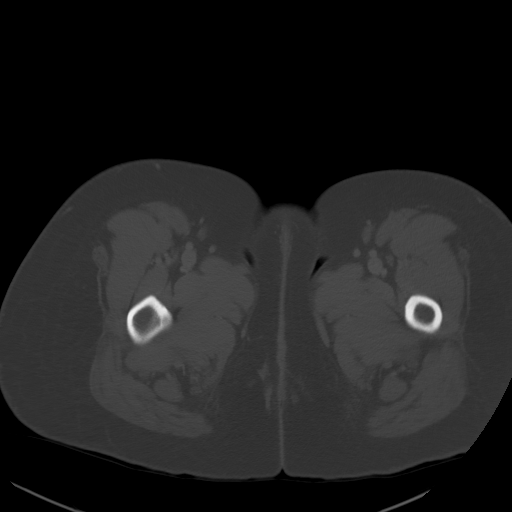
[im 14/92  soft-tissue]
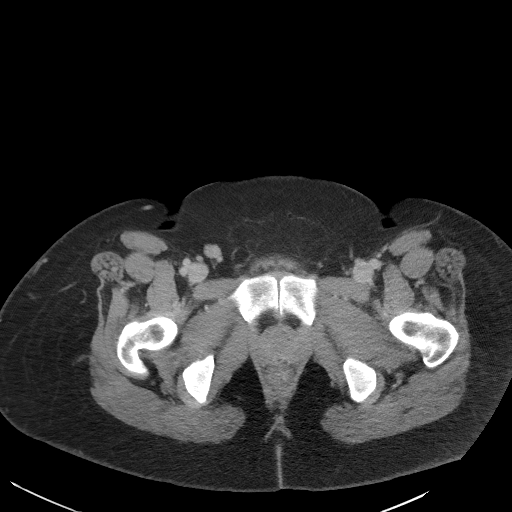
[im 19/92  soft-tissue]
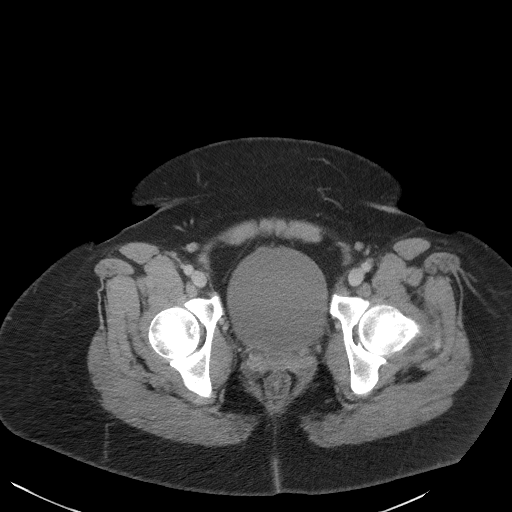
[im 23/92  soft-tissue]
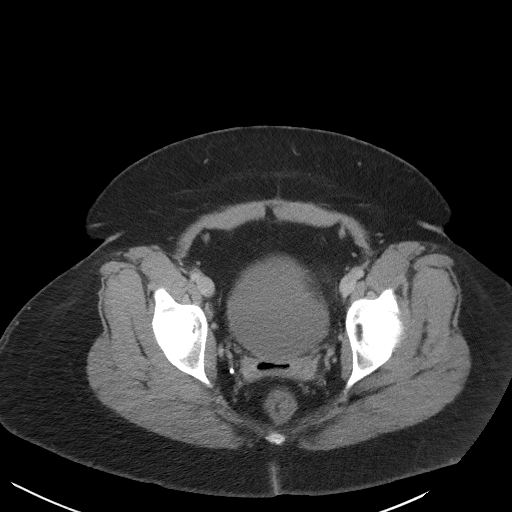
[im 32/92  soft-tissue]
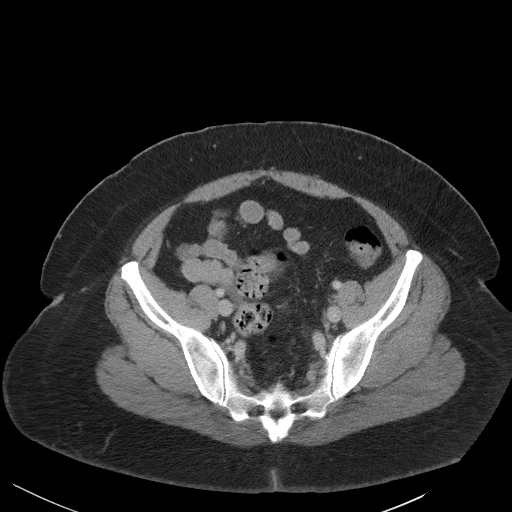
[im 37/92  soft-tissue]
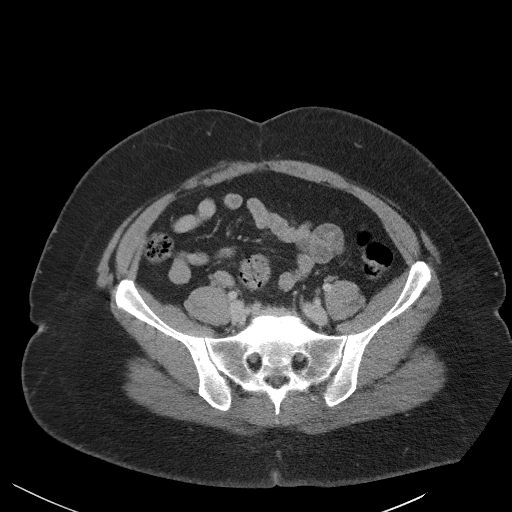
[im 41/92  soft-tissue]
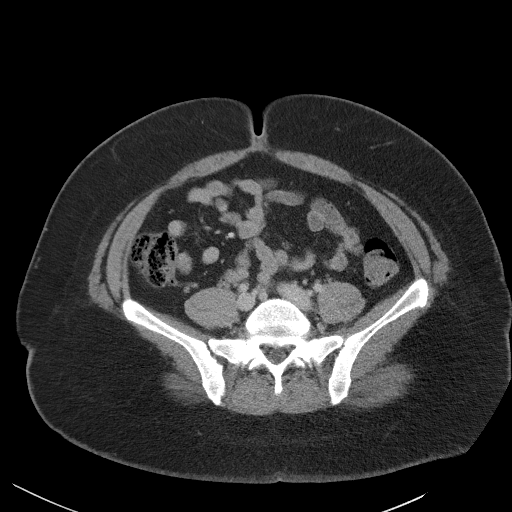
[im 51/92  soft-tissue]
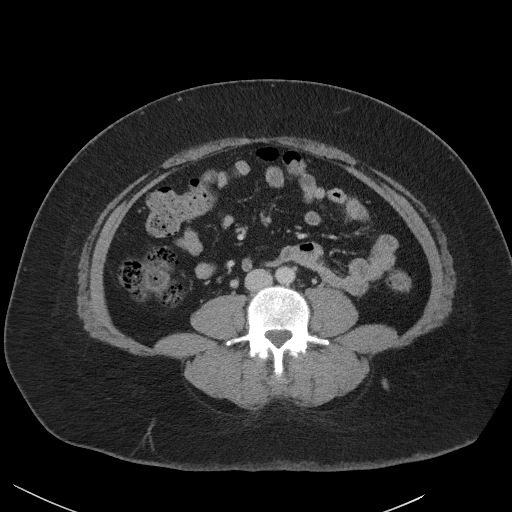
[im 55/92  soft-tissue]
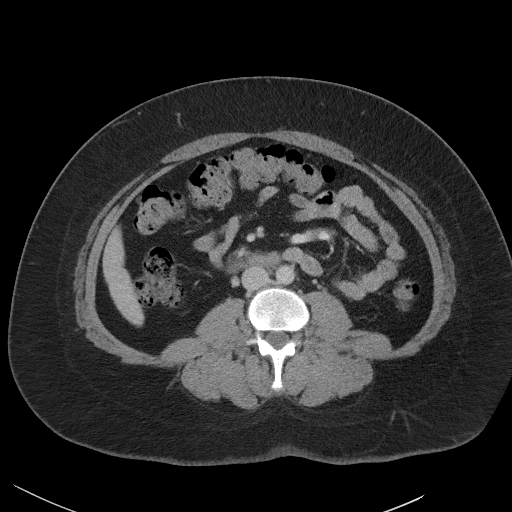
[im 55/92  bone]
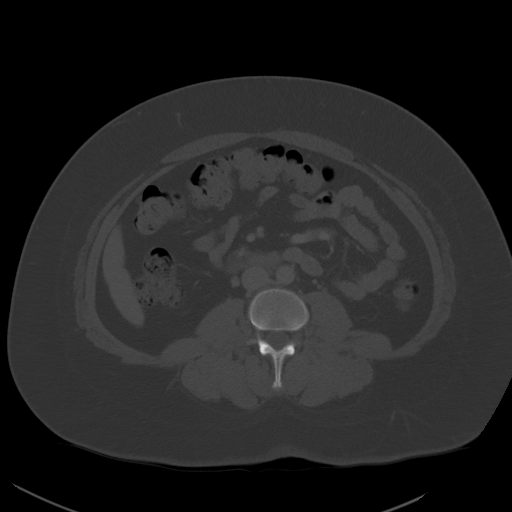
[im 60/92  soft-tissue]
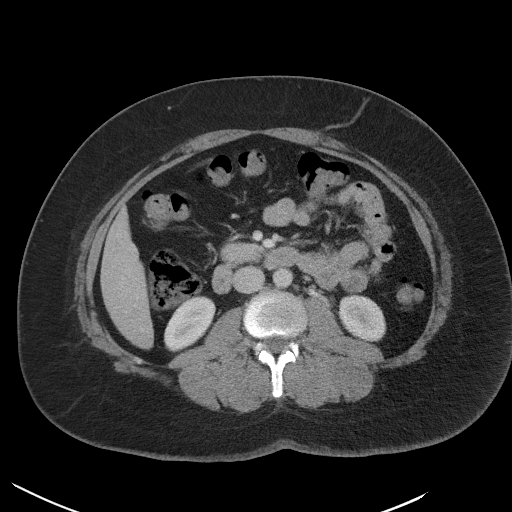
[im 69/92  soft-tissue]
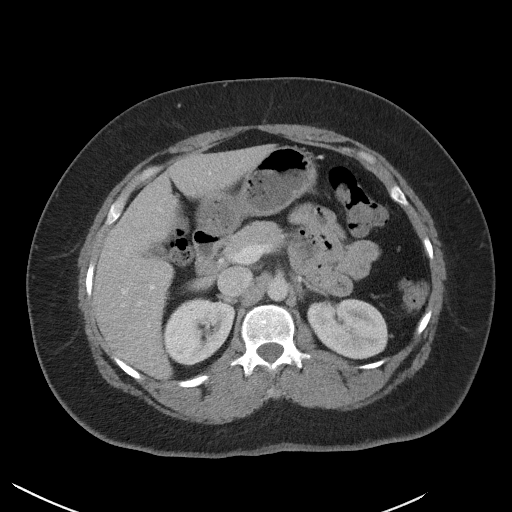
[im 73/92  soft-tissue]
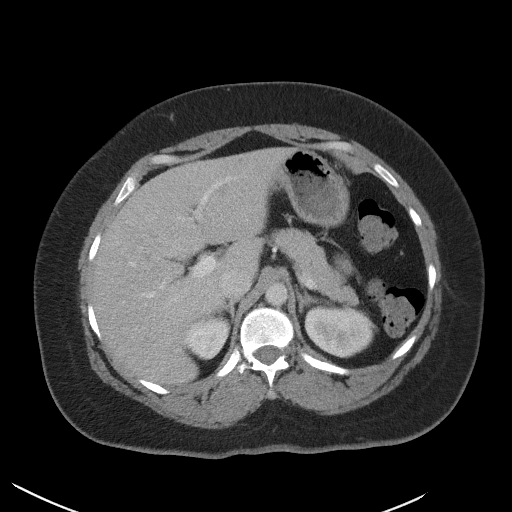
[im 78/92  soft-tissue]
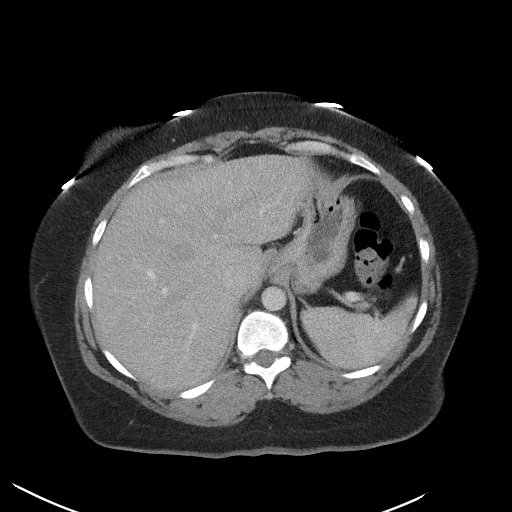
[im 87/92  soft-tissue]
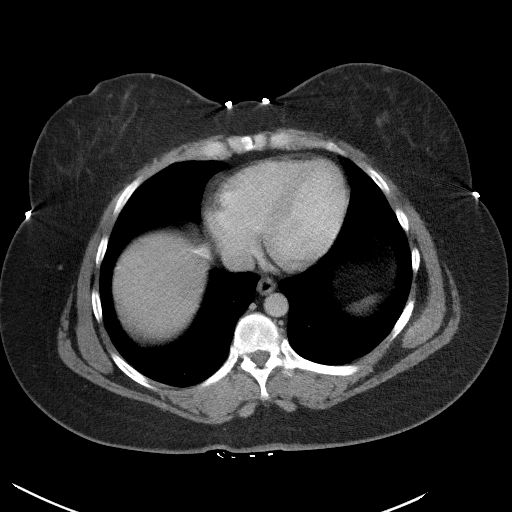

[Series 5: coronal st · coronal · 0.87mm/px · 3 of 111 slices shown]
[im 37/111  soft-tissue]
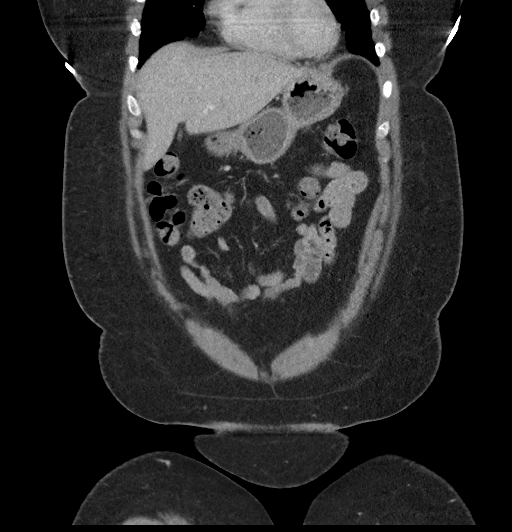
[im 49/111  soft-tissue]
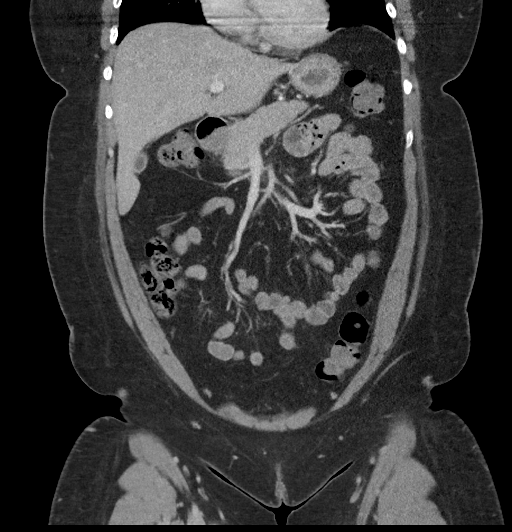
[im 62/111  soft-tissue]
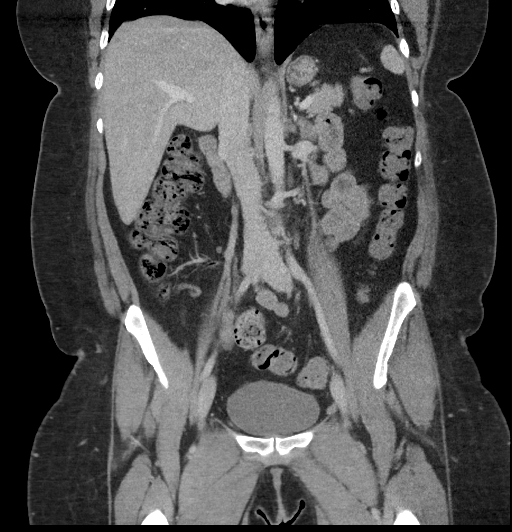

[17 of 46 positions shown; findings below may reference images not displayed]

FINDINGS: Lower chest: No acute abnormality.

Hepatobiliary: A 1.5 cm diameter cyst is seen within the anterior
aspect of the right lobe of the liver. No gallstones, gallbladder
wall thickening, or biliary dilatation.

Pancreas: Unremarkable. No pancreatic ductal dilatation or
surrounding inflammatory changes.

Spleen: Normal in size without focal abnormality.

Adrenals/Urinary Tract: Adrenal glands are unremarkable. Kidneys are
normal, without renal calculi, focal lesion, or hydronephrosis.
Bladder is unremarkable.

Stomach/Bowel: Stomach is within normal limits. Appendix appears
normal. No evidence of bowel wall thickening, distention, or
inflammatory changes.

Vascular/Lymphatic: No significant vascular findings are present. No
enlarged abdominal or pelvic lymph nodes.

Reproductive: Status post hysterectomy. No adnexal masses.

Other: No abdominal wall hernia or abnormality. No abdominopelvic
ascites.

Musculoskeletal: No acute or significant osseous findings.
IMPRESSION: 1. Small hepatic cyst.
2. Status post hysterectomy.
